# Patient Record
Sex: Female | Born: 1940 | Race: White | Hispanic: No | Marital: Married | Smoking: Former smoker
Health system: Southern US, Community
[De-identification: ages and names within clinical notes are randomized; demographics above are authoritative.]

## PROBLEM LIST (undated history)

## (undated) DIAGNOSIS — E119 Type 2 diabetes mellitus without complications: Secondary | ICD-10-CM

## (undated) DIAGNOSIS — I4891 Unspecified atrial fibrillation: Secondary | ICD-10-CM

## (undated) DIAGNOSIS — I81 Portal vein thrombosis: Principal | ICD-10-CM

## (undated) DIAGNOSIS — I10 Essential (primary) hypertension: Secondary | ICD-10-CM

## (undated) DIAGNOSIS — R160 Hepatomegaly, not elsewhere classified: Secondary | ICD-10-CM

## (undated) DIAGNOSIS — E7800 Pure hypercholesterolemia, unspecified: Secondary | ICD-10-CM

## (undated) DIAGNOSIS — D649 Anemia, unspecified: Secondary | ICD-10-CM

## (undated) DIAGNOSIS — K766 Portal hypertension: Secondary | ICD-10-CM

## (undated) DIAGNOSIS — K55069 Acute infarction of intestine, part and extent unspecified: Secondary | ICD-10-CM

## (undated) DIAGNOSIS — D61818 Other pancytopenia: Secondary | ICD-10-CM

## (undated) DIAGNOSIS — IMO0002 Reserved for concepts with insufficient information to code with codable children: Secondary | ICD-10-CM

## (undated) DIAGNOSIS — Z9289 Personal history of other medical treatment: Secondary | ICD-10-CM

## (undated) DIAGNOSIS — K859 Acute pancreatitis without necrosis or infection, unspecified: Secondary | ICD-10-CM

## (undated) DIAGNOSIS — D735 Infarction of spleen: Secondary | ICD-10-CM

## (undated) HISTORY — PX: SQUAMOUS CELL CARCINOMA EXCISION: SHX2433

## (undated) HISTORY — PX: HERNIA REPAIR: SHX51

## (undated) HISTORY — PX: TONSILLECTOMY: SUR1361

## (undated) HISTORY — PX: COSMETIC SURGERY: SHX468

---

## 1979-07-10 HISTORY — PX: CHOLECYSTECTOMY: SHX55

## 1979-07-10 HISTORY — PX: APPENDECTOMY: SHX54

## 2014-04-08 HISTORY — PX: COLON SURGERY: SHX602

## 2014-04-08 HISTORY — PX: UMBILICAL HERNIA REPAIR: SHX196

## 2014-06-18 ENCOUNTER — Inpatient Hospital Stay (HOSPITAL_COMMUNITY)
Admission: EM | Admit: 2014-06-18 | Discharge: 2014-06-24 | DRG: 441 | Disposition: A | Discharge status: Home / Self Care | Payer: Medicare HMO | Attending: Internal Medicine | Admitting: Internal Medicine

## 2014-06-18 ENCOUNTER — Emergency Department (HOSPITAL_COMMUNITY): Payer: Medicare HMO

## 2014-06-18 ENCOUNTER — Inpatient Hospital Stay (HOSPITAL_COMMUNITY): Payer: Medicare HMO

## 2014-06-18 ENCOUNTER — Encounter (HOSPITAL_COMMUNITY): Payer: Self-pay | Admitting: Emergency Medicine

## 2014-06-18 DIAGNOSIS — D65 Disseminated intravascular coagulation [defibrination syndrome]: Secondary | ICD-10-CM | POA: Diagnosis present

## 2014-06-18 DIAGNOSIS — E119 Type 2 diabetes mellitus without complications: Secondary | ICD-10-CM | POA: Diagnosis present

## 2014-06-18 DIAGNOSIS — I10 Essential (primary) hypertension: Secondary | ICD-10-CM | POA: Diagnosis present

## 2014-06-18 DIAGNOSIS — K55059 Acute (reversible) ischemia of intestine, part and extent unspecified: Secondary | ICD-10-CM | POA: Diagnosis present

## 2014-06-18 DIAGNOSIS — I81 Portal vein thrombosis: Principal | ICD-10-CM | POA: Diagnosis present

## 2014-06-18 DIAGNOSIS — I868 Varicose veins of other specified sites: Secondary | ICD-10-CM | POA: Diagnosis present

## 2014-06-18 DIAGNOSIS — D6859 Other primary thrombophilia: Secondary | ICD-10-CM | POA: Diagnosis present

## 2014-06-18 DIAGNOSIS — R160 Hepatomegaly, not elsewhere classified: Secondary | ICD-10-CM | POA: Diagnosis present

## 2014-06-18 DIAGNOSIS — K763 Infarction of liver: Secondary | ICD-10-CM | POA: Diagnosis present

## 2014-06-18 DIAGNOSIS — K769 Liver disease, unspecified: Secondary | ICD-10-CM | POA: Diagnosis present

## 2014-06-18 DIAGNOSIS — R1013 Epigastric pain: Secondary | ICD-10-CM | POA: Diagnosis present

## 2014-06-18 DIAGNOSIS — D61818 Other pancytopenia: Secondary | ICD-10-CM | POA: Diagnosis present

## 2014-06-18 DIAGNOSIS — Z87891 Personal history of nicotine dependence: Secondary | ICD-10-CM

## 2014-06-18 DIAGNOSIS — I749 Embolism and thrombosis of unspecified artery: Secondary | ICD-10-CM | POA: Diagnosis present

## 2014-06-18 DIAGNOSIS — Z8719 Personal history of other diseases of the digestive system: Secondary | ICD-10-CM

## 2014-06-18 DIAGNOSIS — C44721 Squamous cell carcinoma of skin of unspecified lower limb, including hip: Secondary | ICD-10-CM | POA: Diagnosis present

## 2014-06-18 DIAGNOSIS — K8070 Calculus of gallbladder and bile duct without cholecystitis without obstruction: Secondary | ICD-10-CM | POA: Diagnosis present

## 2014-06-18 DIAGNOSIS — K859 Acute pancreatitis without necrosis or infection, unspecified: Secondary | ICD-10-CM | POA: Insufficient documentation

## 2014-06-18 DIAGNOSIS — E785 Hyperlipidemia, unspecified: Secondary | ICD-10-CM | POA: Diagnosis present

## 2014-06-18 DIAGNOSIS — K766 Portal hypertension: Secondary | ICD-10-CM | POA: Diagnosis present

## 2014-06-18 DIAGNOSIS — K55069 Acute infarction of intestine, part and extent unspecified: Secondary | ICD-10-CM

## 2014-06-18 DIAGNOSIS — K838 Other specified diseases of biliary tract: Secondary | ICD-10-CM | POA: Diagnosis present

## 2014-06-18 DIAGNOSIS — I480 Paroxysmal atrial fibrillation: Secondary | ICD-10-CM

## 2014-06-18 DIAGNOSIS — R1900 Intra-abdominal and pelvic swelling, mass and lump, unspecified site: Secondary | ICD-10-CM | POA: Diagnosis present

## 2014-06-18 DIAGNOSIS — I498 Other specified cardiac arrhythmias: Secondary | ICD-10-CM | POA: Diagnosis present

## 2014-06-18 DIAGNOSIS — D735 Infarction of spleen: Secondary | ICD-10-CM

## 2014-06-18 DIAGNOSIS — Z86718 Personal history of other venous thrombosis and embolism: Secondary | ICD-10-CM

## 2014-06-18 DIAGNOSIS — I4891 Unspecified atrial fibrillation: Secondary | ICD-10-CM | POA: Diagnosis present

## 2014-06-18 DIAGNOSIS — K750 Abscess of liver: Secondary | ICD-10-CM | POA: Diagnosis present

## 2014-06-18 DIAGNOSIS — D7389 Other diseases of spleen: Secondary | ICD-10-CM | POA: Diagnosis present

## 2014-06-18 DIAGNOSIS — R001 Bradycardia, unspecified: Secondary | ICD-10-CM

## 2014-06-18 DIAGNOSIS — Z7901 Long term (current) use of anticoagulants: Secondary | ICD-10-CM

## 2014-06-18 DIAGNOSIS — K7689 Other specified diseases of liver: Secondary | ICD-10-CM | POA: Diagnosis present

## 2014-06-18 DIAGNOSIS — I748 Embolism and thrombosis of other arteries: Secondary | ICD-10-CM | POA: Diagnosis present

## 2014-06-18 DIAGNOSIS — R748 Abnormal levels of other serum enzymes: Secondary | ICD-10-CM | POA: Diagnosis present

## 2014-06-18 HISTORY — DX: Portal vein thrombosis: I81

## 2014-06-18 HISTORY — DX: Other pancytopenia: D61.818

## 2014-06-18 HISTORY — DX: Acute pancreatitis without necrosis or infection, unspecified: K85.90

## 2014-06-18 HISTORY — DX: Hepatomegaly, not elsewhere classified: R16.0

## 2014-06-18 HISTORY — DX: Unspecified atrial fibrillation: I48.91

## 2014-06-18 HISTORY — DX: Acute infarction of intestine, part and extent unspecified: K55.069

## 2014-06-18 HISTORY — DX: Type 2 diabetes mellitus without complications: E11.9

## 2014-06-18 HISTORY — DX: Pure hypercholesterolemia, unspecified: E78.00

## 2014-06-18 HISTORY — DX: Infarction of spleen: D73.5

## 2014-06-18 HISTORY — DX: Essential (primary) hypertension: I10

## 2014-06-18 HISTORY — DX: Anemia, unspecified: D64.9

## 2014-06-18 HISTORY — DX: Personal history of other medical treatment: Z92.89

## 2014-06-18 HISTORY — DX: Portal hypertension: K76.6

## 2014-06-18 HISTORY — DX: Reserved for concepts with insufficient information to code with codable children: IMO0002

## 2014-06-18 LAB — COMPREHENSIVE METABOLIC PANEL
ALT: 135 U/L — ABNORMAL HIGH (ref 0–35)
ANION GAP: 17 — AB (ref 5–15)
AST: 357 U/L — ABNORMAL HIGH (ref 0–37)
Albumin: 3.7 g/dL (ref 3.5–5.2)
Alkaline Phosphatase: 127 U/L — ABNORMAL HIGH (ref 39–117)
BUN: 18 mg/dL (ref 6–23)
CALCIUM: 9.2 mg/dL (ref 8.4–10.5)
CO2: 22 meq/L (ref 19–32)
CREATININE: 0.85 mg/dL (ref 0.50–1.10)
Chloride: 101 mEq/L (ref 96–112)
GFR calc Af Amer: 78 mL/min — ABNORMAL LOW (ref >90)
GFR calc non Af Amer: 67 mL/min — ABNORMAL LOW (ref >90)
GLUCOSE: 392 mg/dL — AB (ref 70–99)
Potassium: 4.7 mEq/L (ref 3.7–5.3)
SODIUM: 140 meq/L (ref 137–147)
TOTAL PROTEIN: 6.5 g/dL (ref 6.0–8.3)
Total Bilirubin: 2.2 mg/dL — ABNORMAL HIGH (ref 0.3–1.2)

## 2014-06-18 LAB — I-STAT CHEM 8, ED
BUN: 21 mg/dL (ref 6–23)
CALCIUM ION: 1.13 mmol/L (ref 1.13–1.30)
Chloride: 103 mEq/L (ref 96–112)
Creatinine, Ser: 1 mg/dL (ref 0.50–1.10)
Glucose, Bld: 405 mg/dL — ABNORMAL HIGH (ref 70–99)
HEMATOCRIT: 37 % (ref 36.0–46.0)
Hemoglobin: 12.6 g/dL (ref 12.0–15.0)
Potassium: 4.3 mEq/L (ref 3.7–5.3)
Sodium: 138 mEq/L (ref 137–147)
TCO2: 25 mmol/L (ref 0–100)

## 2014-06-18 LAB — URINALYSIS, ROUTINE W REFLEX MICROSCOPIC
BILIRUBIN URINE: NEGATIVE
GLUCOSE, UA: 100 mg/dL — AB
KETONES UR: NEGATIVE mg/dL
Leukocytes, UA: NEGATIVE
NITRITE: NEGATIVE
Protein, ur: 30 mg/dL — AB
Specific Gravity, Urine: 1.009 (ref 1.005–1.030)
Urobilinogen, UA: 1 mg/dL (ref 0.0–1.0)
pH: 6 (ref 5.0–8.0)

## 2014-06-18 LAB — URINE MICROSCOPIC-ADD ON

## 2014-06-18 LAB — CBG MONITORING, ED: Glucose-Capillary: 384 mg/dL — ABNORMAL HIGH (ref 70–99)

## 2014-06-18 LAB — CBC WITH DIFFERENTIAL/PLATELET
Basophils Absolute: 0 10*3/uL (ref 0.0–0.1)
Basophils Relative: 0 % (ref 0–1)
EOS ABS: 0.1 10*3/uL (ref 0.0–0.7)
EOS PCT: 1 % (ref 0–5)
HEMATOCRIT: 36.4 % (ref 36.0–46.0)
Hemoglobin: 12.2 g/dL (ref 12.0–15.0)
LYMPHS ABS: 1 10*3/uL (ref 0.7–4.0)
Lymphocytes Relative: 15 % (ref 12–46)
MCH: 29.1 pg (ref 26.0–34.0)
MCHC: 33.5 g/dL (ref 30.0–36.0)
MCV: 86.9 fL (ref 78.0–100.0)
MONO ABS: 0.2 10*3/uL (ref 0.1–1.0)
MONOS PCT: 4 % (ref 3–12)
Neutro Abs: 5.5 10*3/uL (ref 1.7–7.7)
Neutrophils Relative %: 80 % — ABNORMAL HIGH (ref 43–77)
PLATELETS: 123 10*3/uL — AB (ref 150–400)
RBC: 4.19 MIL/uL (ref 3.87–5.11)
RDW: 14 % (ref 11.5–15.5)
WBC: 6.9 10*3/uL (ref 4.0–10.5)

## 2014-06-18 LAB — PROTIME-INR
INR: 2.01 — ABNORMAL HIGH (ref 0.00–1.49)
Prothrombin Time: 22.8 seconds — ABNORMAL HIGH (ref 11.6–15.2)

## 2014-06-18 LAB — I-STAT TROPONIN, ED: TROPONIN I, POC: 0.02 ng/mL (ref 0.00–0.08)

## 2014-06-18 LAB — GLUCOSE, CAPILLARY
GLUCOSE-CAPILLARY: 221 mg/dL — AB (ref 70–99)
Glucose-Capillary: 313 mg/dL — ABNORMAL HIGH (ref 70–99)

## 2014-06-18 LAB — LIPASE, BLOOD: Lipase: >3000 U/L — ABNORMAL HIGH (ref 11–59)

## 2014-06-18 LAB — HEPARIN LEVEL (UNFRACTIONATED)

## 2014-06-18 MED ORDER — INSULIN ASPART 100 UNIT/ML ~~LOC~~ SOLN
0.0000 [IU] | SUBCUTANEOUS | Status: DC
  Administered 2014-06-18: 5 [IU] via SUBCUTANEOUS
  Administered 2014-06-19: 8 [IU] via SUBCUTANEOUS
  Administered 2014-06-19: 3 [IU] via SUBCUTANEOUS
  Administered 2014-06-19: 8 [IU] via SUBCUTANEOUS
  Administered 2014-06-20 (×2): 5 [IU] via SUBCUTANEOUS
  Administered 2014-06-21: 3 [IU] via SUBCUTANEOUS
  Administered 2014-06-21 (×2): 100 [IU] via SUBCUTANEOUS
  Administered 2014-06-21: 11 [IU] via SUBCUTANEOUS

## 2014-06-18 MED ORDER — ONDANSETRON HCL 4 MG/2ML IJ SOLN
4.0000 mg | Freq: Four times a day (QID) | INTRAMUSCULAR | Status: DC | PRN
  Administered 2014-06-20: 4 mg via INTRAVENOUS
  Filled 2014-06-18: qty 2

## 2014-06-18 MED ORDER — HYDROMORPHONE HCL PF 1 MG/ML IJ SOLN: 1.0000 mg | Freq: Once | INTRAMUSCULAR | Status: DC

## 2014-06-18 MED ORDER — MORPHINE SULFATE 2 MG/ML IJ SOLN: 2.0000 mg | INTRAMUSCULAR | Status: DC | PRN

## 2014-06-18 MED ORDER — IOHEXOL 300 MG/ML  SOLN
80.0000 mL | Freq: Once | INTRAMUSCULAR | Status: AC | PRN
  Administered 2014-06-18: 80 mL via INTRAVENOUS

## 2014-06-18 MED ORDER — SODIUM CHLORIDE 0.9 % IJ SOLN
3.0000 mL | Freq: Two times a day (BID) | INTRAMUSCULAR | Status: DC
  Administered 2014-06-18 – 2014-06-24 (×7): 3 mL via INTRAVENOUS

## 2014-06-18 MED ORDER — SODIUM CHLORIDE 0.9 % IV SOLN
INTRAVENOUS | Status: DC
  Administered 2014-06-18 – 2014-06-19 (×3): via INTRAVENOUS

## 2014-06-18 MED ORDER — FENTANYL CITRATE 0.05 MG/ML IJ SOLN
25.0000 ug | Freq: Once | INTRAMUSCULAR | Status: AC
  Administered 2014-06-18: 25 ug via INTRAVENOUS
  Filled 2014-06-18: qty 2

## 2014-06-18 MED ORDER — HYDROMORPHONE HCL PF 1 MG/ML IJ SOLN
0.5000 mg | INTRAMUSCULAR | Status: DC | PRN
  Administered 2014-06-19 – 2014-06-20 (×3): 0.5 mg via INTRAVENOUS
  Filled 2014-06-18 (×3): qty 1

## 2014-06-18 MED ORDER — SODIUM CHLORIDE 0.9 % IV BOLUS (SEPSIS)
1000.0000 mL | Freq: Once | INTRAVENOUS | Status: AC
  Administered 2014-06-18: 1000 mL via INTRAVENOUS

## 2014-06-18 MED ORDER — SODIUM CHLORIDE 0.9 % IV BOLUS (SEPSIS)
1000.0000 mL | Freq: Once | INTRAVENOUS | Status: AC
Start: 2014-06-18 — End: 2014-06-18
  Administered 2014-06-18: 1000 mL via INTRAVENOUS

## 2014-06-18 MED ORDER — INSULIN ASPART 100 UNIT/ML ~~LOC~~ SOLN: 0.0000 [IU] | Freq: Three times a day (TID) | SUBCUTANEOUS | Status: DC

## 2014-06-18 MED ORDER — SODIUM CHLORIDE 0.9 % IV SOLN: INTRAVENOUS | Status: DC

## 2014-06-18 MED ORDER — HEPARIN (PORCINE) IN NACL 100-0.45 UNIT/ML-% IJ SOLN
950.0000 [IU]/h | INTRAMUSCULAR | Status: DC
  Administered 2014-06-18: 950 [IU]/h via INTRAVENOUS
  Filled 2014-06-18: qty 250

## 2014-06-18 MED ORDER — HYDROMORPHONE HCL PF 1 MG/ML IJ SOLN
0.5000 mg | Freq: Once | INTRAMUSCULAR | Status: AC
  Administered 2014-06-18: 0.5 mg via INTRAVENOUS
  Filled 2014-06-18: qty 1

## 2014-06-18 MED ORDER — ONDANSETRON HCL 4 MG PO TABS
4.0000 mg | ORAL_TABLET | Freq: Four times a day (QID) | ORAL | Status: DC | PRN
  Administered 2014-06-21: 4 mg via ORAL
  Filled 2014-06-18: qty 1

## 2014-06-18 NOTE — ED Provider Notes (Signed)
Medical screening examination/treatment/procedure(s) were conducted as a shared visit with non-physician practitioner(s) and myself.  I personally evaluated the patient during the encounter. 73yo F, c/o upper abd "pain" that began approximately noon PTA. Describes the discomfort as "pressure," with radiation into her lower mid-chest. Has been associated with N/V. Pt endorses hx of pancreatitis, states her symptoms today are similar. States she was admitted to a hospital in [place] the first week of June for "a blood clot in my stomach." Pt is unable to be more specific. Records requested. VSS, A&O, vague historian, CTA, RRR, neuro non-focal, +TTP mid-epigastric area. LFT's and lipase elevated. [place] with CBD dilatation. NPO, IVF, GI to consult, medicine to admit.     EKG Interpretation   Date/Time:  Tuesday June 18 2014 13:00:17 EDT Ventricular Rate:  50 PR Interval:  143 QRS Duration: 95 QT Interval:  458 QTC Calculation: 418 R Axis:   -14 Text Interpretation:  Sinus rhythm Abnormal R-wave progression, early  transition LVH with secondary repolarization abnormality Baseline wander  No old tracing to compare Confirmed by [facility]  MD, Nunzio Cory [phone]) on  06/18/2014 1:33:50 PM      Results for orders placed during the hospital encounter of 06/18/14  CBC WITH DIFFERENTIAL      Result Value Ref Range   WBC 6.9  4.0 - 10.5 K/uL   RBC 4.19  3.87 - 5.11 MIL/uL   Hemoglobin 12.2  12.0 - 15.0 g/dL   HCT 36.4  36.0 - 46.0 %   MCV 86.9  78.0 - 100.0 fL   MCH 29.1  26.0 - 34.0 pg   MCHC 33.5  30.0 - 36.0 g/dL   RDW 14.0  11.5 - 15.5 %   Platelets 123 (*) 150 - 400 K/uL   Neutrophils Relative % 80 (*) 43 - 77 %   Neutro Abs 5.5  1.7 - 7.7 K/uL   Lymphocytes Relative 15  12 - 46 %   Lymphs Abs 1.0  0.7 - 4.0 K/uL   Monocytes Relative 4  3 - 12 %   Monocytes Absolute 0.2  0.1 - 1.0 K/uL   Eosinophils Relative 1  0 - 5 %   Eosinophils Absolute 0.1  0.0 - 0.7 K/uL   Basophils Relative 0  0 - 1  %   Basophils Absolute 0.0  0.0 - 0.1 K/uL  COMPREHENSIVE METABOLIC PANEL      Result Value Ref Range   Sodium 140  137 - 147 mEq/L   Potassium 4.7  3.7 - 5.3 mEq/L   Chloride 101  96 - 112 mEq/L   CO2 22  19 - 32 mEq/L   Glucose, Bld 392 (*) 70 - 99 mg/dL   BUN 18  6 - 23 mg/dL   Creatinine, Ser 0.85  0.50 - 1.10 mg/dL   Calcium 9.2  8.4 - 10.5 mg/dL   Total Protein 6.5  6.0 - 8.3 g/dL   Albumin 3.7  3.5 - 5.2 g/dL   AST 357 (*) 0 - 37 U/L   ALT 135 (*) 0 - 35 U/L   Alkaline Phosphatase 127 (*) 39 - 117 U/L   Total Bilirubin 2.2 (*) 0.3 - 1.2 mg/dL   GFR calc non Af Amer 67 (*) >90 mL/min   GFR calc Af Amer 78 (*) >90 mL/min   Anion gap 17 (*) 5 - 15  LIPASE, BLOOD      Result Value Ref Range   Lipase >3000 (*) 11 - 59 U/L  CBG MONITORING, ED      Result Value Ref Range   Glucose-Capillary 384 (*) 70 - 99 mg/dL  I-STAT TROPOININ, ED      Result Value Ref Range   Troponin i, poc 0.02  0.00 - 0.08 ng/mL   Comment 3           I-STAT CHEM 8, ED      Result Value Ref Range   Sodium 138  137 - 147 mEq/L   Potassium 4.3  3.7 - 5.3 mEq/L   Chloride 103  96 - 112 mEq/L   BUN 21  6 - 23 mg/dL   Creatinine, Ser 1.00  0.50 - 1.10 mg/dL   Glucose, Bld 405 (*) 70 - 99 mg/dL   Calcium, Ion 1.13  1.13 - 1.30 mmol/L   TCO2 25  0 - 100 mmol/L   Hemoglobin 12.6  12.0 - 15.0 g/dL   HCT 37.0  36.0 - 46.0 %   Dg Chest 2 View 06/18/2014   CLINICAL DATA:  Chest pain  EXAM: CHEST  2 VIEW  COMPARISON:  None.  FINDINGS: The cardiac and mediastinal silhouettes are stable in size and contour, and remain within normal limits.  The lungs are normally inflated. No airspace consolidation, pleural effusion, or pulmonary edema is identified. There is no pneumothorax.  No acute osseous abnormality identified. Minimal degenerative spurring noted within the mid thoracic spine. Surgical clips overlie the left axilla.  IMPRESSION: No active cardiopulmonary disease.   Electronically Signed   By: Jeannine Boga M.D.   On: 06/18/2014 14:00   US Abdomen Complete 06/18/2014   CLINICAL DATA:  Upper abdominal pain, elevated LFTs  EXAM: ULTRASOUND ABDOMEN COMPLETE  COMPARISON:  None.  FINDINGS: Gallbladder:  Surgically absent  Common bile duct:  Diameter: CBD dilatation with CBD measuring 18.3 mm.  Liver:  There is heterogeneous liver with intrahepatic biliary ductal dilatation. Hypoechoic cystic lesion in right hepatic lobe centrally measures 1.7 x 1.5 cm.  IVC:  No abnormality visualized.  Pancreas:  Limited assessment due to abundant bowel gas  Spleen:  Size and appearance within normal limits.  Right Kidney:  Length: 11 cm. Echogenicity within normal limits. No mass or hydronephrosis visualized.  Left Kidney:  Length: 12.9 cm. Echogenicity within normal limits. No mass or hydronephrosis visualized.  Bilateral renal cortical thinning probable due to atrophy.  Abdominal aorta:  No aneurysm visualized.  Measures up to 2.7 cm in diameter.  Other findings:  None.  IMPRESSION: 1. Surgically absent gallbladder.  CBD dilatation up to 1.8 cm. 2. There is intrahepatic biliary ductal dilatation. Cystic lesion in right hepatic lobe centrally measures 1.7 x 1.5 cm. Further correlation with enhanced CT or MRI could be performed. 3. Bilateral renal cortical thinning probable due to atrophy. No hydronephrosis. No focal renal mass. 4. No aortic aneurysm.   Electronically Signed   By: Lahoma Crocker M.D.   On: 06/18/2014 16:18      Francine Graven, DO 06/20/14 1546

## 2014-06-18 NOTE — ED Notes (Addendum)
Pt returns from  Radiology. Placed  Back on cardiac monitoring.

## 2014-06-18 NOTE — ED Provider Notes (Signed)
CSN: [id number]     Arrival date & time 06/18/14  1250 History   First MD Initiated Contact with Patient 06/18/14 1325     Chief Complaint  Patient presents with  . Chest Pain     (Consider location/radiation/quality/duration/timing/severity/associated sxs/prior Treatment) HPI Comments: Wendy Meza is a 73 y.o. female with a past medical history of HTN, DM, a-fib presenting the Emergency Department with a chief complaint of epigastric abdominal pain onset 1200, while driving. The patient reports increase in chest discomfort since onset of abdominal pain. Patient reports mid sternal pain, non-radiating. She reports associated emesis. Denies shortness of breath. Patient reports she had a procedure to "remove a blood clot in her abdomen" in June in [place].   Patient reports lightheadedness.  Upon further questioning the patient reports she has a history of cholecystectomy, and pancreatitis. She reports pancreatitis and 2002 was treated at [facility]. She reports cholecystectomy prior to pancreatitis. Denies EtOH use, tobacco use.  Patient is a 73 y.o. female presenting with chest pain. The history is provided by the patient. No language interpreter was used.  Chest Pain Associated symptoms: abdominal pain, nausea and vomiting   Associated symptoms: no fever, no palpitations and no shortness of breath     Past Medical History  Diagnosis Date  . Hypertension   . Diabetes mellitus without complication   . Cancer   . A-fib    History reviewed. No pertinent past surgical history. No family history on file. History  Substance Use Topics  . Smoking status: Never Smoker   . Smokeless tobacco: Not on file  . Alcohol Use: No   OB History   Grav Para Term Preterm Abortions TAB SAB Ect Mult Living                 Review of Systems  Constitutional: Negative for fever and chills.  Respiratory: Negative for shortness of breath.   Cardiovascular: Positive for chest pain.  Negative for palpitations and leg swelling.  Gastrointestinal: Positive for nausea, vomiting and abdominal pain. Negative for diarrhea and constipation.      Allergies  Polysporin  Home Medications   Prior to Admission medications   Not on File   BP 124/45  Pulse 50  Temp(Src) 98 F (36.7 C) (Oral)  Resp 18  SpO2 99% Physical Exam  Nursing note and vitals reviewed. Constitutional: She is oriented to person, place, and time. She appears well-developed and well-nourished. No distress.  Appears uncomfortable  HENT:  Head: Normocephalic and atraumatic.  Eyes: EOM are normal. Pupils are equal, round, and reactive to light.  Neck: Neck supple.  Cardiovascular: Regular rhythm.  Bradycardia present.   Murmur heard. Trace pitting edema bilaterally.  Abdominal: Soft. Normal appearance. She exhibits no distension and no mass. There is tenderness in the right upper quadrant and epigastric area. There is guarding. There is no rebound.  Well healed lower midline scar.  Musculoskeletal: Normal range of motion.  Neurological: She is alert and oriented to person, place, and time.  Skin: Skin is warm. She is not diaphoretic.  Psychiatric: She has a normal mood and affect. Her behavior is normal.    ED Course  Procedures (including critical care time) Labs Review Results for orders placed during the hospital encounter of 06/18/14  CBC WITH DIFFERENTIAL      Result Value Ref Range   WBC 6.9  4.0 - 10.5 K/uL   RBC 4.19  3.87 - 5.11 MIL/uL   Hemoglobin 12.2  12.0 - 15.0 g/dL   HCT 36.4  36.0 - 46.0 %   MCV 86.9  78.0 - 100.0 fL   MCH 29.1  26.0 - 34.0 pg   MCHC 33.5  30.0 - 36.0 g/dL   RDW 14.0  11.5 - 15.5 %   Platelets 123 (*) 150 - 400 K/uL   Neutrophils Relative % 80 (*) 43 - 77 %   Neutro Abs 5.5  1.7 - 7.7 K/uL   Lymphocytes Relative 15  12 - 46 %   Lymphs Abs 1.0  0.7 - 4.0 K/uL   Monocytes Relative 4  3 - 12 %   Monocytes Absolute 0.2  0.1 - 1.0 K/uL   Eosinophils  Relative 1  0 - 5 %   Eosinophils Absolute 0.1  0.0 - 0.7 K/uL   Basophils Relative 0  0 - 1 %   Basophils Absolute 0.0  0.0 - 0.1 K/uL  COMPREHENSIVE METABOLIC PANEL      Result Value Ref Range   Sodium 140  137 - 147 mEq/L   Potassium 4.7  3.7 - 5.3 mEq/L   Chloride 101  96 - 112 mEq/L   CO2 22  19 - 32 mEq/L   Glucose, Bld 392 (*) 70 - 99 mg/dL   BUN 18  6 - 23 mg/dL   Creatinine, Ser 0.85  0.50 - 1.10 mg/dL   Calcium 9.2  8.4 - 10.5 mg/dL   Total Protein 6.5  6.0 - 8.3 g/dL   Albumin 3.7  3.5 - 5.2 g/dL   AST 357 (*) 0 - 37 U/L   ALT 135 (*) 0 - 35 U/L   Alkaline Phosphatase 127 (*) 39 - 117 U/L   Total Bilirubin 2.2 (*) 0.3 - 1.2 mg/dL   GFR calc non Af Amer 67 (*) >90 mL/min   GFR calc Af Amer 78 (*) >90 mL/min   Anion gap 17 (*) 5 - 15  LIPASE, BLOOD      Result Value Ref Range   Lipase >3000 (*) 11 - 59 U/L  CBG MONITORING, ED      Result Value Ref Range   Glucose-Capillary 384 (*) 70 - 99 mg/dL  I-STAT TROPOININ, ED      Result Value Ref Range   Troponin i, poc 0.02  0.00 - 0.08 ng/mL   Comment 3           I-STAT CHEM 8, ED      Result Value Ref Range   Sodium 138  137 - 147 mEq/L   Potassium 4.3  3.7 - 5.3 mEq/L   Chloride 103  96 - 112 mEq/L   BUN 21  6 - 23 mg/dL   Creatinine, Ser 1.00  0.50 - 1.10 mg/dL   Glucose, Bld 405 (*) 70 - 99 mg/dL   Calcium, Ion 1.13  1.13 - 1.30 mmol/L   TCO2 25  0 - 100 mmol/L   Hemoglobin 12.6  12.0 - 15.0 g/dL   HCT 37.0  36.0 - 46.0 %   Imaging Review Dg Chest 2 View  06/18/2014   CLINICAL DATA:  Chest pain  EXAM: CHEST  2 VIEW  COMPARISON:  None.  FINDINGS: The cardiac and mediastinal silhouettes are stable in size and contour, and remain within normal limits.  The lungs are normally inflated. No airspace consolidation, pleural effusion, or pulmonary edema is identified. There is no pneumothorax.  No acute osseous abnormality identified. Minimal degenerative spurring noted within the mid thoracic  spine. Surgical clips  overlie the left axilla.  IMPRESSION: No active cardiopulmonary disease.   Electronically Signed   By: Jeannine Boga M.D.   On: 06/18/2014 14:00   US Abdomen Complete  06/18/2014   CLINICAL DATA:  Upper abdominal pain, elevated LFTs  EXAM: ULTRASOUND ABDOMEN COMPLETE  COMPARISON:  None.  FINDINGS: Gallbladder:  Surgically absent  Common bile duct:  Diameter: CBD dilatation with CBD measuring 18.3 mm.  Liver:  There is heterogeneous liver with intrahepatic biliary ductal dilatation. Hypoechoic cystic lesion in right hepatic lobe centrally measures 1.7 x 1.5 cm.  IVC:  No abnormality visualized.  Pancreas:  Limited assessment due to abundant bowel gas  Spleen:  Size and appearance within normal limits.  Right Kidney:  Length: 11 cm. Echogenicity within normal limits. No mass or hydronephrosis visualized.  Left Kidney:  Length: 12.9 cm. Echogenicity within normal limits. No mass or hydronephrosis visualized.  Bilateral renal cortical thinning probable due to atrophy.  Abdominal aorta:  No aneurysm visualized.  Measures up to 2.7 cm in diameter.  Other findings:  None.  IMPRESSION: 1. Surgically absent gallbladder.  CBD dilatation up to 1.8 cm. 2. There is intrahepatic biliary ductal dilatation. Cystic lesion in right hepatic lobe centrally measures 1.7 x 1.5 cm. Further correlation with enhanced CT or MRI could be performed. 3. Bilateral renal cortical thinning probable due to atrophy. No hydronephrosis. No focal renal mass. 4. No aortic aneurysm.   Electronically Signed   By: Lahoma Crocker M.D.   On: 06/18/2014 16:18     EKG Interpretation   Date/Time:  Tuesday June 18 2014 13:00:17 EDT Ventricular Rate:  50 PR Interval:  143 QRS Duration: 95 QT Interval:  458 QTC Calculation: 418 R Axis:   -14 Text Interpretation:  Sinus rhythm Abnormal R-wave progression, early  transition LVH with secondary repolarization abnormality Baseline wander  No old tracing to compare Confirmed by [facility]  MD,  Nunzio Cory [phone]) on  06/18/2014 1:33:50 PM      MDM   Final diagnoses:  Acute pancreatitis, unspecified pancreatitis type  Dilated cbd, acquired   Pt presents with chest/epigastric tenderness on palpation.  The patient's pulse 49 on arrival, BP 124/45.  Labs sent. Questionable abdominal etiologic vs cardiac. Pt has unknown surgery previously, history of A-fib. Poor historian.  CBG, 384 fluids given. Dr. Thurnell Garbe also evaluated the pt during this encounter. CMP shows elevation of LFTs, Lipase >3000. US abdomen ordered. Re-evaluation HR 69, reports mild resolution of symptoms.  Discussed lab results, pt reports history of cholecystectomy several years ago and history of pancreatitis.  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 US shows dilated CBD, discussed with Dr. Benson Norway who agrees to admission. Consult to medicine for admission. Pt care assumed by [place], PA-C at shift change, awaiting returned page for admission. Meds given in ED:  Medications  fentaNYL (SUBLIMAZE) injection 25 mcg (25 mcg Intravenous Given 06/18/14 1422)  sodium chloride 0.9 % bolus 1,000 mL (1,000 mLs Intravenous New Bag/Given 06/18/14 1354)  HYDROmorphone (DILAUDID) injection 0.5 mg (0.5 mg Intravenous Given 06/18/14 1526)    New Prescriptions   No medications on file      Harvie Heck, PA-C 06/20/14 1036

## 2014-06-18 NOTE — ED Notes (Signed)
Consulting MD at bedside

## 2014-06-18 NOTE — H&P (Signed)
Date: 06/18/2014               Patient Name:  Wendy Meza MRN: [id number]  DOB: 31-May-1941 Age / Sex: 73 y.o., female   PCP: Provider Default, MD         Medical Service: Internal Medicine Teaching Service         Attending Physician: Dr. Axel Filler, MD    First Contact: Dr. Marvel Plan Pager: [phone]  Second Contact: Dr. Alice Rieger Pager: [phone]       After Hours (After 5p/  First Contact Pager: [phone]  weekends / holidays): Second Contact Pager: [phone]   Chief Complaint: Epigastric pain  History of Present Illness: Wendy Meza is a 73 year old woman with history of HTN, DM, atrial fibrillation on Eliquis, pancreatitis in 6440 complicated by pseudocyst s/p drainage, ischemic colitis s/p ex lap 04/12/2014 presenting with epigastric pain, nausea, and vomiting. She reports the epigastric pain started this afternoon. It was constant, dull pain. She last ate at 8AM. No exacerbating factors identified. She reports dizziness and lightheadedness and steatorrhea - last BM this AM. She denies fevers, chills, chest pain, shortness of breath, melena, hematochezia, dysuria, hematuria, dark urine, edema, rash, jaundice, pruritus, myalgias. This pain is similar to her past episode of pancreatitis in 2002. Last colonoscopy 4-5 years ago without any abnormal findings per patient. She had an upper scope in 2002 during the pancreatitis.  Meds: Current Facility-Administered Medications  Medication Dose Route Frequency Provider Last Rate Last Dose  . 0.9 %  sodium chloride infusion   Intravenous Continuous Francine Graven, DO 75 mL/hr at 06/18/14 1700    . 0.9 %  sodium chloride infusion   Intravenous Continuous Beryle Beams, MD       Current Outpatient Prescriptions  Medication Sig Dispense Refill  . CRESTOR 20 MG tablet Take 20 mg by mouth at bedtime.       Marland Kitchen ELIQUIS 5 MG TABS tablet Take 5 mg by mouth 2 (two) times daily.      . furosemide (LASIX) 40 MG tablet Take 40 mg by mouth daily.        Marland Kitchen HUMALOG KWIKPEN 100 UNIT/ML KiwkPen Inject 10 Units into the skin daily as needed (takes 10 units if CBG > 160).       Marland Kitchen KLOR-CON M20 20 MEQ tablet Take 20 mEq by mouth 2 (two) times daily.      Marland Kitchen LANTUS SOLOSTAR 100 UNIT/ML Solostar Pen Inject 30 Units into the skin at bedtime.      Marland Kitchen lisinopril (PRINIVIL,ZESTRIL) 40 MG tablet Take 20 mg by mouth 2 (two) times daily.       . metFORMIN (GLUCOPHAGE) 500 MG tablet Take 500 mg by mouth 2 (two) times daily.      . metoprolol (LOPRESSOR) 50 MG tablet Take 25 mg by mouth every morning.       . ranitidine (ZANTAC) 150 MG tablet Take 150 mg by mouth 2 (two) times daily.      . sotalol (BETAPACE) 80 MG tablet Take 80 mg by mouth 2 (two) times daily.       Marland Kitchen NITROSTAT 0.4 MG SL tablet Place 0.4 mg under the tongue every 5 (five) minutes as needed.        Allergies: Allergies as of 06/18/2014 - Review Complete 06/18/2014  Allergen Reaction Noted  . Polysporin [bacitracin-polymyxin b] Other (See Comments) 06/18/2014   Past Medical History  Diagnosis Date  . Hypertension   . Diabetes mellitus without complication   .  Cancer   . A-fib   . Pancreatitis   Squamous cell of bilateral lower extremity Ischemic colitis 04/12/2014  Past Surgical History  Procedure Laterality Date  . Cholecystectomy    Ex lap for ischemic colitis 04/12/2014 Pancreatic pseudocyst drainage in 2002  No family history on file. Denies family history of pancreatic, liver, or gallbladder disease.  History   Social History  . Marital Status: Married    Spouse Name: N/A    Number of Children: N/A  . Years of Education: N/A   Occupational History  . Not on file.   Social History Main Topics  . Smoking status: Never Smoker   . Smokeless tobacco: Not on file  . Alcohol Use: No  . Drug Use: Not on file  . Sexual Activity: Not on file   Other Topics Concern  . Not on file   Social History Narrative  . No narrative on file    Review of Systems: Constitutional:  no fevers/chills Eyes: no vision changes Ears, nose, mouth, throat, and face: no cough Respiratory: no shortness of breath Cardiovascular: no chest pain Gastrointestinal: per HPI Genitourinary: no dysuria, no hematuria Integument: no rash Hematologic/lymphatic: no bleeding/bruising, no edema Musculoskeletal: no myalgias Neurological: no paresthesias, no weakness  Physical Exam: Blood pressure 166/62, pulse 63, temperature 98 F (36.7 C), temperature source Oral, resp. rate 18, SpO2 94.00%. General Apperance: NAD Head: Normocephalic, atraumatic Eyes: PERRL, EOMI, anicteric sclera Ears: Nares normal, septum midline, mucosa normal Throat: Lips, mucosa and tongue normal  Neck: Supple, trachea midline Back: No tenderness or bony abnormality  Lungs: Clear to auscultation bilaterally. No wheezes, rhonchi or rales. Breathing comfortably Chest Wall: Nontender, no deformity Heart: Regular rate and rhythm, no murmur/rub/gallop Abdomen: Soft, moderately tender to palpation in epigastric region, nondistended, no rebound/guarding Extremities: Normal, well healed scars in bilateral lower extremities from squamous cell carcinoma resection, warm and well perfused, no edema Pulses: 2+ throughout Skin: No rashes or lesions Neurologic: Alert and oriented x 3. CNII-XII intact. Normal strength and sensation  Lab results: Basic Metabolic Panel:  Recent Labs  06/18/14 1333 06/18/14 1358  NA 140 138  K 4.7 4.3  CL 101 103  CO2 22  --   GLUCOSE 392* 405*  BUN 18 21  CREATININE 0.85 1.00  CALCIUM 9.2  --    Liver Function Tests:  Recent Labs  06/18/14 1333  AST 357*  ALT 135*  ALKPHOS 127*  BILITOT 2.2*  PROT 6.5  ALBUMIN 3.7    Recent Labs  06/18/14 1333  LIPASE >3000*    CBC:  Recent Labs  06/18/14 1333 06/18/14 1358  WBC 6.9  --   NEUTROABS 5.5  --   HGB 12.2 12.6  HCT 36.4 37.0  MCV 86.9  --   PLT 123*  --    CBG:  Recent Labs  06/18/14 1303  GLUCAP 384*     Urinalysis    Component Value Date/Time   COLORURINE YELLOW 06/18/2014 1918   APPEARANCEUR HAZY* 06/18/2014 1918   LABSPEC 1.009 06/18/2014 1918   PHURINE 6.0 06/18/2014 1918   GLUCOSEU 100* 06/18/2014 1918   HGBUR LARGE* 06/18/2014 1918   BILIRUBINUR NEGATIVE 06/18/2014 1918   KETONESUR NEGATIVE 06/18/2014 1918   PROTEINUR 30* 06/18/2014 1918   UROBILINOGEN 1.0 06/18/2014 1918   NITRITE NEGATIVE 06/18/2014 1918   LEUKOCYTESUR NEGATIVE 06/18/2014 1918    Misc. Labs: Troponin POC 0.02  Imaging results:  Dg Chest 2 View  06/18/2014   CLINICAL DATA:  Chest  pain  EXAM: CHEST  2 VIEW  COMPARISON:  None.  FINDINGS: The cardiac and mediastinal silhouettes are stable in size and contour, and remain within normal limits.  The lungs are normally inflated. No airspace consolidation, pleural effusion, or pulmonary edema is identified. There is no pneumothorax.  No acute osseous abnormality identified. Minimal degenerative spurring noted within the mid thoracic spine. Surgical clips overlie the left axilla.  IMPRESSION: No active cardiopulmonary disease.   Electronically Signed   By: Jeannine Boga M.D.   On: 06/18/2014 14:00   US Abdomen Complete  06/18/2014   CLINICAL DATA:  Upper abdominal pain, elevated LFTs  EXAM: ULTRASOUND ABDOMEN COMPLETE  COMPARISON:  None.  FINDINGS: Gallbladder:  Surgically absent  Common bile duct:  Diameter: CBD dilatation with CBD measuring 18.3 mm.  Liver:  There is heterogeneous liver with intrahepatic biliary ductal dilatation. Hypoechoic cystic lesion in right hepatic lobe centrally measures 1.7 x 1.5 cm.  IVC:  No abnormality visualized.  Pancreas:  Limited assessment due to abundant bowel gas  Spleen:  Size and appearance within normal limits.  Right Kidney:  Length: 11 cm. Echogenicity within normal limits. No mass or hydronephrosis visualized.  Left Kidney:  Length: 12.9 cm. Echogenicity within normal limits. No mass or hydronephrosis visualized.  Bilateral renal  cortical thinning probable due to atrophy.  Abdominal aorta:  No aneurysm visualized.  Measures up to 2.7 cm in diameter.  Other findings:  None.  IMPRESSION: 1. Surgically absent gallbladder.  CBD dilatation up to 1.8 cm. 2. There is intrahepatic biliary ductal dilatation. Cystic lesion in right hepatic lobe centrally measures 1.7 x 1.5 cm. Further correlation with enhanced CT or MRI could be performed. 3. Bilateral renal cortical thinning probable due to atrophy. No hydronephrosis. No focal renal mass. 4. No aortic aneurysm.   Electronically Signed   By: Lahoma Crocker M.D.   On: 06/18/2014 16:18    Other results: EKG: normal EKG, normal sinus rhythm, there are no previous tracings available for comparison.  Assessment & Plan by Problem:  Epigastric pain, N/V: lipase >3000. Highly concerning for acute pancreatitis. AST, ALT, and T bili elevated. [place] of abdomen demonstrates CBD dilatation up to 1.8cm. Worrisome for obstruction either from common bile duct gallstone or mass. Other differential diagnoses include duodenal ulcer and bowel infarction, although less likely. GI consulted by ED. -CT abd/pelvis with IV contrast -NPO, IVF NS@75ml /hr, pain control with dilaudid prn -zofran prn nausea -ERCP Thursday afternoon per GI -hold Eliquis, start heparin gtt  A fib: On Eliquis at home. In sinus rhythm today.  -hold Eliquis, start heparin gtt  DM: on metformin and insulin at home -Hold home insulin and metformin -SSI, Accuchecks  HTN:  -hold home furosemide, lisinopril, metoprolol, sotalol  HLD: chronic, stable -hold home Crestor  FEN: -NPO -NS@75ml /hr  DVT ppx: heparin gtt  Dispo: Disposition is deferred at this time, awaiting improvement of current medical problems. Anticipated discharge in approximately 2 day(s).   The patient does have a current PCP (Provider Default, MD) and does not need an [facility] hospital follow-up appointment after discharge.  The patient does not know have  transportation limitations that hinder transportation to clinic appointments.  Signed: Jacques Earthly, MD 06/18/2014, 7:39 PM

## 2014-06-18 NOTE — ED Notes (Signed)
Patient with chest pain starting approx 30 min ago, patient states mid sternal pain and pressure, n/v associated, patient with history of htn,a-fib, and recent surgery in June 15, for blood clot in abdomen

## 2014-06-18 NOTE — ED Notes (Signed)
Faxed Release of Info to [facility]

## 2014-06-18 NOTE — Consult Note (Signed)
Unassigned Consult  Reason for Consult: Acute pancreatitis and elevated liver enzymes Referring Physician: [facility] HPI: This is a 73 year old female with a PMH of DM, HTN, s/p cholecystectomy in the 1980's for cholelithiasis, acute pancreatitis in 2000 admitted for acute pancreatitis.  She is visit from [place], but she is from [place], [place].  Today she started to have acute epigastric abdominal pain and further evaluation revealed that she has a lipase >3,000.  An ultrasound was performed and it was notable for a dilated CBD at 1.8 cm, but no evidence of any stones.  Currently she does feel better with pain medications.  In 2000 she had a similar episodes and she was told that her pancreatitis was idiopathic.  During this evaluation her liver enzymes have increased consistent with an obstructive pattern.  Incidentally she had abdominal surgery in June this year for an abdominal clot and she takes Eliquis, but she does not know the etiology of the abdominal clot.  Past Medical History  Diagnosis Date  . Hypertension   . Diabetes mellitus without complication   . Cancer   . A-fib   . Pancreatitis     Past Surgical History  Procedure Laterality Date  . Cholecystectomy      No family history on file.  Social History:  reports that she has never smoked. She does not have any smokeless tobacco history on file. She reports that she does not drink alcohol. Her drug history is not on file.  Allergies:  Allergies  Allergen Reactions  . Polysporin [Bacitracin-Polymyxin B] Other (See Comments)    unknown    Medications:  Scheduled:  Continuous: . sodium chloride 75 mL/hr at 06/18/14 1700  . sodium chloride      Results for orders placed during the hospital encounter of 06/18/14 (from the past 24 hour(s))  CBG MONITORING, ED     Status: Abnormal   Collection Time    06/18/14  1:03 PM      Result Value Ref Range   Glucose-Capillary 384 (*) 70 - 99 mg/dL  CBC WITH  DIFFERENTIAL     Status: Abnormal   Collection Time    06/18/14  1:33 PM      Result Value Ref Range   WBC 6.9  4.0 - 10.5 K/uL   RBC 4.19  3.87 - 5.11 MIL/uL   Hemoglobin 12.2  12.0 - 15.0 g/dL   HCT 36.4  36.0 - 46.0 %   MCV 86.9  78.0 - 100.0 fL   MCH 29.1  26.0 - 34.0 pg   MCHC 33.5  30.0 - 36.0 g/dL   RDW 14.0  11.5 - 15.5 %   Platelets 123 (*) 150 - 400 K/uL   Neutrophils Relative % 80 (*) 43 - 77 %   Neutro Abs 5.5  1.7 - 7.7 K/uL   Lymphocytes Relative 15  12 - 46 %   Lymphs Abs 1.0  0.7 - 4.0 K/uL   Monocytes Relative 4  3 - 12 %   Monocytes Absolute 0.2  0.1 - 1.0 K/uL   Eosinophils Relative 1  0 - 5 %   Eosinophils Absolute 0.1  0.0 - 0.7 K/uL   Basophils Relative 0  0 - 1 %   Basophils Absolute 0.0  0.0 - 0.1 K/uL  COMPREHENSIVE METABOLIC PANEL     Status: Abnormal   Collection Time    06/18/14  1:33 PM      Result Value Ref Range  Sodium 140  137 - 147 mEq/L   Potassium 4.7  3.7 - 5.3 mEq/L   Chloride 101  96 - 112 mEq/L   CO2 22  19 - 32 mEq/L   Glucose, Bld 392 (*) 70 - 99 mg/dL   BUN 18  6 - 23 mg/dL   Creatinine, Ser 0.85  0.50 - 1.10 mg/dL   Calcium 9.2  8.4 - 10.5 mg/dL   Total Protein 6.5  6.0 - 8.3 g/dL   Albumin 3.7  3.5 - 5.2 g/dL   AST 357 (*) 0 - 37 U/L   ALT 135 (*) 0 - 35 U/L   Alkaline Phosphatase 127 (*) 39 - 117 U/L   Total Bilirubin 2.2 (*) 0.3 - 1.2 mg/dL   GFR calc non Af Amer 67 (*) >90 mL/min   GFR calc Af Amer 78 (*) >90 mL/min   Anion gap 17 (*) 5 - 15  LIPASE, BLOOD     Status: Abnormal   Collection Time    06/18/14  1:33 PM      Result Value Ref Range   Lipase >3000 (*) 11 - 59 U/L  I-STAT TROPOININ, ED     Status: None   Collection Time    06/18/14  1:56 PM      Result Value Ref Range   Troponin i, poc 0.02  0.00 - 0.08 ng/mL   Comment 3           I-STAT CHEM 8, ED     Status: Abnormal   Collection Time    06/18/14  1:58 PM      Result Value Ref Range   Sodium 138  137 - 147 mEq/L   Potassium 4.3  3.7 - 5.3 mEq/L    Chloride 103  96 - 112 mEq/L   BUN 21  6 - 23 mg/dL   Creatinine, Ser 1.00  0.50 - 1.10 mg/dL   Glucose, Bld 405 (*) 70 - 99 mg/dL   Calcium, Ion 1.13  1.13 - 1.30 mmol/L   TCO2 25  0 - 100 mmol/L   Hemoglobin 12.6  12.0 - 15.0 g/dL   HCT 37.0  36.0 - 46.0 %     Dg Chest 2 View  06/18/2014   CLINICAL DATA:  Chest pain  EXAM: CHEST  2 VIEW  COMPARISON:  None.  FINDINGS: The cardiac and mediastinal silhouettes are stable in size and contour, and remain within normal limits.  The lungs are normally inflated. No airspace consolidation, pleural effusion, or pulmonary edema is identified. There is no pneumothorax.  No acute osseous abnormality identified. Minimal degenerative spurring noted within the mid thoracic spine. Surgical clips overlie the left axilla.  IMPRESSION: No active cardiopulmonary disease.   Electronically Signed   By: Jeannine Boga M.D.   On: 06/18/2014 14:00   US Abdomen Complete  06/18/2014   CLINICAL DATA:  Upper abdominal pain, elevated LFTs  EXAM: ULTRASOUND ABDOMEN COMPLETE  COMPARISON:  None.  FINDINGS: Gallbladder:  Surgically absent  Common bile duct:  Diameter: CBD dilatation with CBD measuring 18.3 mm.  Liver:  There is heterogeneous liver with intrahepatic biliary ductal dilatation. Hypoechoic cystic lesion in right hepatic lobe centrally measures 1.7 x 1.5 cm.  IVC:  No abnormality visualized.  Pancreas:  Limited assessment due to abundant bowel gas  Spleen:  Size and appearance within normal limits.  Right Kidney:  Length: 11 cm. Echogenicity within normal limits. No mass or hydronephrosis visualized.  Left Kidney:  Length:  12.9 cm. Echogenicity within normal limits. No mass or hydronephrosis visualized.  Bilateral renal cortical thinning probable due to atrophy.  Abdominal aorta:  No aneurysm visualized.  Measures up to 2.7 cm in diameter.  Other findings:  None.  IMPRESSION: 1. Surgically absent gallbladder.  CBD dilatation up to 1.8 cm. 2. There is intrahepatic  biliary ductal dilatation. Cystic lesion in right hepatic lobe centrally measures 1.7 x 1.5 cm. Further correlation with enhanced CT or MRI could be performed. 3. Bilateral renal cortical thinning probable due to atrophy. No hydronephrosis. No focal renal mass. 4. No aortic aneurysm.   Electronically Signed   By: Lahoma Crocker M.D.   On: 06/18/2014 16:18    ROS:  As stated above in the HPI otherwise negative.  Blood pressure 149/70, pulse 63, temperature 98 F (36.7 C), temperature source Oral, resp. rate 14, SpO2 90.00%.    PE: Gen: NAD, Alert and Oriented HEENT:  [place]/AT, EOMI Neck: Supple, no LAD Lungs: CTA Bilaterally CV: RRR without M/G/R ABM: Soft, tender in the epigastric region, +BS Ext: No C/C/E  Assessment/Plan: 1) Probable gallstone pancreatitis. 2) Hypercoagulable state on anticoagulation. 3) DM. 4) HTN.   Her clinical presentation is very consistent with a gallstone pancreatitis.  I do not know if she passed a stone or if she still has a retained stone.  Regardless she does require an ERCP.  Even if she does not have any ductal stones, she is still at risk for a recurrence in the future and a sphincterotomy can reduce this chance.  I discussed the procedure with the patient and the risks of the procedure such as bleeding, infection, perforation, and pancreatitis.  She is agreeable to undergoing the procedure.  With her anticoagulation Eliquis takes 24 hours to get out of her system, but a sphincterotomy will place her at risk for significant bleeding.  This can be a difficult area to control bleeding if bleeding occurs.  I would prefer 48 hours before attempting the ERCP.  Plan: 1) ERCP Thursday afternoon. 2) Hold Eliquis. 3) Bridging therapy per Hospitalist. 4) Continue with pain control. 5) NPO for now.  Wendy Meza D 06/18/2014, 5:59 PM

## 2014-06-18 NOTE — ED Notes (Signed)
Faxed paperwork to [facility]

## 2014-06-18 NOTE — ED Notes (Signed)
MD made aware that HR decreased to 39. Remains at 41 bpm at this time.

## 2014-06-18 NOTE — Progress Notes (Addendum)
ANTICOAGULATION CONSULT NOTE - Initial Consult  Pharmacy Consult for Heparin Indication: atrial fibrillation  Allergies  Allergen Reactions  . Polysporin [Bacitracin-Polymyxin B] Other (See Comments)    unknown    Patient Measurements: Weight: 140 lb 14 oz (63.9 kg)  Vital Signs: Temp: 97.8 F (36.6 C) (08/11 1955) Temp src: Oral (08/11 1955) BP: 160/90 mmHg (08/11 2030) Pulse Rate: 68 (08/11 2030)  Labs:  Recent Labs  06/18/14 1333 06/18/14 1358  HGB 12.2 12.6  HCT 36.4 37.0  PLT 123*  --   CREATININE 0.85 1.00    CrCl is unknown because there is no height on file for the current visit.   Medical History: Past Medical History  Diagnosis Date  . Hypertension   . Diabetes mellitus without complication   . Cancer   . A-fib   . Pancreatitis     Medications:  Prescriptions prior to admission  Medication Sig Dispense Refill  . CRESTOR 20 MG tablet Take 20 mg by mouth at bedtime.       Marland Kitchen ELIQUIS 5 MG TABS tablet Take 5 mg by mouth 2 (two) times daily.      . furosemide (LASIX) 40 MG tablet Take 40 mg by mouth daily.      Marland Kitchen HUMALOG KWIKPEN 100 UNIT/ML KiwkPen Inject 10 Units into the skin daily as needed (takes 10 units if CBG > 160).       Marland Kitchen KLOR-CON M20 20 MEQ tablet Take 20 mEq by mouth 2 (two) times daily.      Marland Kitchen LANTUS SOLOSTAR 100 UNIT/ML Solostar Pen Inject 30 Units into the skin at bedtime.      Marland Kitchen lisinopril (PRINIVIL,ZESTRIL) 40 MG tablet Take 20 mg by mouth 2 (two) times daily.       . metFORMIN (GLUCOPHAGE) 500 MG tablet Take 500 mg by mouth 2 (two) times daily.      . metoprolol (LOPRESSOR) 50 MG tablet Take 25 mg by mouth every morning.       . ranitidine (ZANTAC) 150 MG tablet Take 150 mg by mouth 2 (two) times daily.      . sotalol (BETAPACE) 80 MG tablet Take 80 mg by mouth 2 (two) times daily.       Marland Kitchen NITROSTAT 0.4 MG SL tablet Place 0.4 mg under the tongue every 5 (five) minutes as needed.        Assessment: 73 yo F admitted 06/18/2014 with  acute pancreatitis.  Pharmacy consulted to dose heparin as a bridge off of apixaban pending ERCP.  PMH: DM, HTN, Afib, Abdominal clot  Coag: Afib, recent abdominal clot s/p surgery 6/15.  Patient reports taking apixaban this am.    Goal of Therapy:  Heparin level 0.3-0.7 units/ml Monitor platelets by anticoagulation protocol: Yes   Plan:  STAT Baseline Xa level, PTT and INR prior to heparin start; evaluate for need to monitor heparin with PTTs Give NONE units bolus x 1 Start heparin infusion at 950 units/hr Check anti-Xa level in 6 hours and daily while on heparin Continue to monitor H&H and platelets  Thank you for allowing pharmacy to be a part of this patients care team.  Rowe Robert Pharm.D., BCPS, AQ-Cardiology Clinical Pharmacist 06/18/2014 9:18 PM Pager: [phone] Phone: [phone]

## 2014-06-18 NOTE — ED Provider Notes (Signed)
PROGRESS NOTE                                                                                                                 This is a sign-out from [place]  at shift change: Wendy Meza is a 73 y.o. female with past medical history significant for non-insulin-dependent diabetes, hypertension, angina, A. fib anticoagulated with Eliquis, ischemic bowel and splenic infarct presenting with l epigastric pain that radiates to the chest. Cardiac workup was negative. Associated symptoms of nausea and vomiting. Patient has a history of pancreatitis and states this feels similar.  Heart is a regular rate and rhythm with no murmurs rubs or gallops, lung sounds are clear to auscultation bilaterally, abdominal exam is benign with tenderness palpation of the epigastrium and no guarding or rebound, normoactive bowel sounds. Reinforced n.p.o. status   PA Jerline Pain has consulted gastroenterology and Dr. Almyra Free will consult, he asked for medical admission.  Case d/w Resident Delfino Lovett of Internal medicine teaching service. Patient will be admitted to a telemetry bed under the care of attending physician Lorenda Hatchet Please refer to previous note for full HPI, ROS, PMH and PE.     Monico Blitz, PA-C 06/18/14 1759

## 2014-06-19 ENCOUNTER — Encounter (HOSPITAL_COMMUNITY): Payer: Self-pay | Admitting: Oncology

## 2014-06-19 DIAGNOSIS — D649 Anemia, unspecified: Secondary | ICD-10-CM

## 2014-06-19 DIAGNOSIS — I10 Essential (primary) hypertension: Secondary | ICD-10-CM

## 2014-06-19 DIAGNOSIS — D735 Infarction of spleen: Secondary | ICD-10-CM

## 2014-06-19 DIAGNOSIS — D696 Thrombocytopenia, unspecified: Secondary | ICD-10-CM

## 2014-06-19 DIAGNOSIS — R748 Abnormal levels of other serum enzymes: Secondary | ICD-10-CM

## 2014-06-19 DIAGNOSIS — K766 Portal hypertension: Secondary | ICD-10-CM

## 2014-06-19 DIAGNOSIS — K55069 Acute infarction of intestine, part and extent unspecified: Secondary | ICD-10-CM

## 2014-06-19 DIAGNOSIS — I4891 Unspecified atrial fibrillation: Secondary | ICD-10-CM

## 2014-06-19 DIAGNOSIS — R1013 Epigastric pain: Secondary | ICD-10-CM | POA: Diagnosis present

## 2014-06-19 DIAGNOSIS — K769 Liver disease, unspecified: Secondary | ICD-10-CM

## 2014-06-19 DIAGNOSIS — R160 Hepatomegaly, not elsewhere classified: Secondary | ICD-10-CM

## 2014-06-19 DIAGNOSIS — I81 Portal vein thrombosis: Principal | ICD-10-CM

## 2014-06-19 DIAGNOSIS — D61818 Other pancytopenia: Secondary | ICD-10-CM

## 2014-06-19 DIAGNOSIS — D7389 Other diseases of spleen: Secondary | ICD-10-CM

## 2014-06-19 HISTORY — DX: Portal vein thrombosis: I81

## 2014-06-19 HISTORY — DX: Other pancytopenia: D61.818

## 2014-06-19 HISTORY — DX: Unspecified atrial fibrillation: I48.91

## 2014-06-19 HISTORY — DX: Acute infarction of intestine, part and extent unspecified: K55.069

## 2014-06-19 HISTORY — DX: Infarction of spleen: D73.5

## 2014-06-19 HISTORY — DX: Portal hypertension: K76.6

## 2014-06-19 HISTORY — DX: Hepatomegaly, not elsewhere classified: R16.0

## 2014-06-19 HISTORY — DX: Essential (primary) hypertension: I10

## 2014-06-19 LAB — GLUCOSE, CAPILLARY
GLUCOSE-CAPILLARY: 284 mg/dL — AB (ref 70–99)
Glucose-Capillary: 147 mg/dL — ABNORMAL HIGH (ref 70–99)
Glucose-Capillary: 120 mg/dL — ABNORMAL HIGH (ref 70–99)
Glucose-Capillary: 174 mg/dL — ABNORMAL HIGH (ref 70–99)
Glucose-Capillary: 280 mg/dL — ABNORMAL HIGH (ref 70–99)

## 2014-06-19 LAB — APTT
aPTT: >200 seconds (ref 24–37)
aPTT: 34 seconds (ref 24–37)
aPTT: 66 seconds — ABNORMAL HIGH (ref 24–37)

## 2014-06-19 LAB — CBC WITH DIFFERENTIAL/PLATELET
BASOS ABS: 0 10*3/uL (ref 0.0–0.1)
BASOS PCT: 0 % (ref 0–1)
Eosinophils Absolute: 0.1 10*3/uL (ref 0.0–0.7)
Eosinophils Relative: 4 % (ref 0–5)
HEMATOCRIT: 29.2 % — AB (ref 36.0–46.0)
HEMOGLOBIN: 9.9 g/dL — AB (ref 12.0–15.0)
LYMPHS PCT: 24 % (ref 12–46)
Lymphs Abs: 0.7 10*3/uL (ref 0.7–4.0)
MCH: 29.4 pg (ref 26.0–34.0)
MCHC: 33.9 g/dL (ref 30.0–36.0)
MCV: 86.6 fL (ref 78.0–100.0)
MONO ABS: 0.3 10*3/uL (ref 0.1–1.0)
Monocytes Relative: 10 % (ref 3–12)
NEUTROS ABS: 1.7 10*3/uL (ref 1.7–7.7)
NEUTROS PCT: 62 % (ref 43–77)
Platelets: 58 10*3/uL — ABNORMAL LOW (ref 150–400)
RBC: 3.37 MIL/uL — ABNORMAL LOW (ref 3.87–5.11)
RDW: 14.3 % (ref 11.5–15.5)
WBC: 2.7 10*3/uL — AB (ref 4.0–10.5)

## 2014-06-19 LAB — HEPATIC FUNCTION PANEL
ALBUMIN: 3.3 g/dL — AB (ref 3.5–5.2)
ALT: 787 U/L — ABNORMAL HIGH (ref 0–35)
AST: 1217 U/L — ABNORMAL HIGH (ref 0–37)
Alkaline Phosphatase: 186 U/L — ABNORMAL HIGH (ref 39–117)
Bilirubin, Direct: 1.4 mg/dL — ABNORMAL HIGH (ref 0.0–0.3)
Indirect Bilirubin: 2.6 mg/dL — ABNORMAL HIGH (ref 0.3–0.9)
Total Bilirubin: 4 mg/dL — ABNORMAL HIGH (ref 0.3–1.2)
Total Protein: 6.1 g/dL (ref 6.0–8.3)

## 2014-06-19 LAB — SAVE SMEAR

## 2014-06-19 LAB — CBC
HCT: 25.7 % — ABNORMAL LOW (ref 36.0–46.0)
Hemoglobin: 8.8 g/dL — ABNORMAL LOW (ref 12.0–15.0)
MCH: 29 pg (ref 26.0–34.0)
MCHC: 34.2 g/dL (ref 30.0–36.0)
MCV: 84.8 fL (ref 78.0–100.0)
Platelets: 54 K/uL — ABNORMAL LOW (ref 150–400)
RBC: 3.03 MIL/uL — ABNORMAL LOW (ref 3.87–5.11)
RDW: 14.2 % (ref 11.5–15.5)
WBC: 2.4 K/uL — ABNORMAL LOW (ref 4.0–10.5)

## 2014-06-19 LAB — HIV ANTIBODY (ROUTINE TESTING W REFLEX): HIV 1&2 Ab, 4th Generation: NONREACTIVE

## 2014-06-19 LAB — HEPATITIS PANEL, ACUTE
HCV Ab: NEGATIVE
Hep A IgM: NONREACTIVE
Hep B C IgM: NONREACTIVE
Hepatitis B Surface Ag: NEGATIVE

## 2014-06-19 LAB — ANTITHROMBIN III: AntiThromb III Func: 69 % — ABNORMAL LOW (ref 75–120)

## 2014-06-19 LAB — LIPID PANEL
CHOL/HDL RATIO: 1.6 ratio
Cholesterol: 73 mg/dL (ref 0–200)
HDL: 47 mg/dL (ref >39)
LDL CALC: 11 mg/dL (ref 0–99)
TRIGLYCERIDES: 73 mg/dL (ref <150)
VLDL: 15 mg/dL (ref 0–40)

## 2014-06-19 LAB — D-DIMER, QUANTITATIVE: D-Dimer, Quant: 4.13 ug{FEU}/mL — ABNORMAL HIGH (ref 0.00–0.48)

## 2014-06-19 LAB — FIBRINOGEN: Fibrinogen: 276 mg/dL (ref 204–475)

## 2014-06-19 LAB — HEPARIN LEVEL (UNFRACTIONATED): Heparin Unfractionated: >2.2 [IU]/mL — ABNORMAL HIGH (ref 0.30–0.70)

## 2014-06-19 LAB — LACTIC ACID, PLASMA: LACTIC ACID, VENOUS: 2.3 mmol/L — AB (ref 0.5–2.2)

## 2014-06-19 MED ORDER — LISINOPRIL 20 MG PO TABS
20.0000 mg | ORAL_TABLET | Freq: Two times a day (BID) | ORAL | Status: DC
  Administered 2014-06-19 – 2014-06-24 (×11): 20 mg via ORAL
  Filled 2014-06-19 (×12): qty 1

## 2014-06-19 MED ORDER — SODIUM CHLORIDE 0.9 % IV SOLN
INTRAVENOUS | Status: DC
  Administered 2014-06-20 (×2): via INTRAVENOUS

## 2014-06-19 MED ORDER — ARGATROBAN 50 MG/50ML IV SOLN
1.0000 ug/kg/min | INTRAVENOUS | Status: DC
  Administered 2014-06-19: 1 ug/kg/min via INTRAVENOUS
  Filled 2014-06-19: qty 50

## 2014-06-19 MED ORDER — RANITIDINE HCL 150 MG/10ML PO SYRP
150.0000 mg | ORAL_SOLUTION | Freq: Two times a day (BID) | ORAL | Status: DC
  Administered 2014-06-19 – 2014-06-20 (×4): 150 mg via ORAL
  Filled 2014-06-19 (×6): qty 10

## 2014-06-19 MED ORDER — METOPROLOL TARTRATE 25 MG PO TABS
25.0000 mg | ORAL_TABLET | Freq: Every day | ORAL | Status: DC
  Administered 2014-06-19: 25 mg via ORAL
  Filled 2014-06-19: qty 1

## 2014-06-19 MED ORDER — SOTALOL HCL 80 MG PO TABS
80.0000 mg | ORAL_TABLET | Freq: Two times a day (BID) | ORAL | Status: DC
  Administered 2014-06-19 – 2014-06-23 (×10): 80 mg via ORAL
  Filled 2014-06-19 (×13): qty 1

## 2014-06-19 MED ORDER — METOPROLOL TARTRATE 25 MG PO TABS
25.0000 mg | ORAL_TABLET | Freq: Two times a day (BID) | ORAL | Status: DC
  Administered 2014-06-20: 25 mg via ORAL
  Filled 2014-06-19 (×2): qty 1

## 2014-06-19 MED ORDER — SODIUM CHLORIDE 0.9 % IV SOLN
0.1000 mg/kg/h | INTRAVENOUS | Status: DC
  Administered 2014-06-19 – 2014-06-23 (×3): 0.1 mg/kg/h via INTRAVENOUS
  Filled 2014-06-19 (×3): qty 250

## 2014-06-19 NOTE — Progress Notes (Signed)
I have reviewed the patients chart concerning the current findings of the CT scan as well as the acute pancytopenia.  The CT scan is negative for any evidence of pancreatitis and there is a question of malignancy.  Her liver enzymes have markedly increased, but clinically she is reportedly better.  I am uncertain if the enzyme elevations are associated with any obstructive issues with her CBD versus another source.  The increase in TB may be associated with DIC, but Dr. Beryle Beams does not feel that this is the case.    I will still keep the patient on the schedule for tomorrow, just in case.  But, it is appearing less likely that she will need an ERCP at this time with the current findings.  I will follow up on the liver panel in the AM.

## 2014-06-19 NOTE — Consult Note (Signed)
Referring MD: Dr. Karlene Lineman  PCP:  Default, Provider, MD   Reason for Referral: Complex coagulopathy and thrombocytopenia   Chief Complaint  Patient presents with  . Chest Pain  . Abdominal Pain    HPI:  73 year old Caucasian woman who moved from [place] to [place] about 6 years ago. She's been in overall excellent health until she moved to [place]. 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 She underwent emergency surgery. She was anticoagulated and likely received heparin. She was sent home on apixiban. Complete records not available at time of this dictation but it appears that she was recently diagnosed with atrial fibrillation around the same time that she required the emergency surgery. She was not told that she had any defects in her heart such as a PFO. She has treated hypertension. She is a type II diabetic and was on both oral agents and insulin.  She had a cholecystectomy in the late 1980s. In 2002 she had a hospital admission for pancreatitis. She has had no recurrent abdominal events until the June hospital admission. She denies any prior history of ulcer disease, hepatitis, yellow jaundice, mononucleosis, or malaria. She did require blood transfusions with recent surgery but not, to her knowledge,  in the past. She now presents again with acute onset of severe upper abdominal pain, nausea, and vomiting. No hematochezia.  Laboratory studies on admission showed liver function abnormalities with disproportionate elevation of transaminases compared with bilirubin and alkaline phosphatase. SGOT 357, SGPT 135, delivered and 2.2, alkaline phosphatase 127. Serum lipase elevated over 3000  units.  CT scan of the abdomen was done and I personally reviewed the images with both invasive and a noninvasive radiologist. 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 Extensive collateral flow has developed and there are large mesenteric varices. Pancreatic head is visualized but the remainder of the pancreas is not and this appears to be a chronic finding compared with the report from the June 5 study done in [place]. There is no radiographic evidence for acute pancreatitis. There are at least 2 distinct soft tissue densities: One against the anterior abdominal wall irregular in shape and appears to have a rim of fatty tissue around it. The other is in the midline right over a surgical clip and may represent a seroma. There are multiple, small, in my opinion, atypical filling defects in the liver. No abnormalities noted on the liver from the June 5 study. There is some dilatation of intrahepatic bile ducts.  Pending further evaluation, the patient was started on parenteral unfractionated heparin and the apixiban was held. She was given aggressive hydration and put at bowel rest.  Overnight there has been a significant change in her CBC. She has developed acute pancytopenia:  hemoglobin fell from 12.2 to 8.8, white count of 6900 with 80% neutrophils down to 2400, platelet count fell from 123,000-54,000. No signs of acute bleeding. Other than some prn  pain medications, including fentanyl and Dilaudid, the only medication she has received in the hospital is heparin.  She appears clinically stable and her abdominal pain has almost completely resolved with conservative treatment.   Past Medical History  Diagnosis Date  . Hypertension   . A-fib   . Pancreatitis  2002; 06/18/2014  . High cholesterol   . Type II diabetes mellitus   . Anemia   . History of blood transfusion 2011-04/2014    "couple times related to OR on my legs; twice when I had blood clot in my stomach"  .  Squamous carcinoma     "legs", S/P radiation; chemo; OR  : No thyroid disease. No MI. No history of seizure or stroke. No inflammatory arthritis.   Past Surgical History  Procedure Laterality Date  . Umbilical hernia repair  04/2014  . Tonsillectomy  1940's  . Cholecystectomy  1980"s  . Appendectomy  1980's  . Hernia repair    . Colon surgery  04/2014    "blood clot in stomach due to AF; they cut it out/fixed it"  . Squamous cell carcinoma excision Bilateral 2011-2015; multiple ORs    "legs"  . Cosmetic surgery Bilateral 2011-2015; multiple times    "legs"  : Current facility-administered medications:0.9 %  sodium chloride infusion, , Intravenous, Continuous, Clinton Gallant, MD, Last Rate: 75 mL/hr at 06/19/14 0552;  0.9 %  sodium chloride infusion, , Intravenous, Continuous, Beryle Beams, MD;  HYDROmorphone (DILAUDID) injection 0.5 mg, 0.5 mg, Intravenous, Q3H PRN, Clinton Gallant, MD insulin aspart (novoLOG) injection 0-15 Units, 0-15 Units, Subcutaneous, 6 times per day, Jacques Earthly, MD, 3 Units at 06/19/14 1256;  ondansetron (ZOFRAN) injection 4 mg, 4 mg, Intravenous, Q6H PRN, Clinton Gallant, MD;  ondansetron [facility]) tablet 4 mg, 4 mg, Oral, Q6H PRN, Clinton Gallant, MD;  sodium chloride 0.9 % injection 3 mL, 3 mL, Intravenous, Q12H, Clinton Gallant, MD, 3 mL at 06/18/14 2114  . insulin aspart  0-15 Units Subcutaneous 6 times per day  . sodium chloride  3 mL Intravenous Q12H  :  Allergies  Allergen Reactions  . Polysporin [Bacitracin-Polymyxin B] Other (See Comments)    unknown  :  History reviewed. No pertinent family history.:  History   Social History  . Marital Status: Married    Spouse Name: N/A    Number of Children:  2   . Years of Education: N/A   Occupational History  . Not on file.   Social History Main Topics  . Smoking status: Former Smoker -- 0.25 packs/day for 20 years    Types: Cigarettes  . Smokeless tobacco: Never Used     Comment: 06/18/2014 "quit smoking 30-40 yrs  ago"  . Alcohol Use: 3.0 oz/week    5 Glasses of wine per week  . Drug Use: No  . Sexual Activity: No   Other Topics Concern  . Not on file   Social History Narrative  . No narrative on file  :  ROS: Eyes: No headache or change in vision Throat: No sore throat Neck: Resp:  No cough or dyspnea Cardio: No chest pain or palpitations GI: See history of present illness Extremities: See history of present illness Lymph nodes:  Neurologic:  See eye exam Skin: . No new skin lesions Genitourinary: Not questioned   Vitals: Filed Vitals:   06/19/14 0426  BP: 171/60  Pulse: 60  Temp: 98.3 F (36.8 C)  Resp: 18    PHYSICAL EXAM: General appearance: Well-nourished Caucasian woman no distress HEENT: Pharynx no erythema or exudate. Neck full range of motion. Lymph Nodes: No cervical, supraclavicular, axillary, or inguinal lymphadenopathy Resp: Lungs clear to auscultation and resonant to percussion Cardio: Regular cardiac rhythm no murmur gallop or rub Vascular: Carotids 2+ no bruits. Dorsalis pedis pulses 2+. Breasts: GI: Abdomen is  soft, minimally tender in the epigastric region, no palpable mass or organomegaly GU: Extremities: Extensive changes on the shins bilaterally from removal of squamous cell carcinomas with skin grafting Neurologic: She is alert and oriented. Full extraocular movements. PERRLA. Motor strength 5 over 5. Reflexes 1+ symmetric. Upper body coordination normal. Gait not tested Skin: No rash or ecchymoses  Labs:   Recent Labs  06/18/14 1333 06/18/14 1358 06/19/14 0706  WBC 6.9  --  2.4*  HGB 12.2 12.6 8.8*  HCT 36.4 37.0 25.7*  PLT 123*  --  54*    Recent Labs  06/18/14 1333 06/18/14 1358  NA 140 138  K 4.7 4.3  CL 101 103  CO2 22  --   GLUCOSE 392* 405*  BUN 18 21  CREATININE 0.85 1.00  CALCIUM 9.2  --     Blood smear review: Normochromic normocytic red cells. No polychromasia. No schistocytes. Decreased total white cells but  neutrophils appear mature. No toxic granulations or Dohle bodies. Platelets are decreased in #3-4 per high power field which correlates with the machine count.  Images Studies/Results:  Dg Chest 2 View  06/18/2014   CLINICAL DATA:  Chest pain  EXAM: CHEST  2 VIEW  COMPARISON:  None.  FINDINGS: The cardiac and mediastinal silhouettes are stable in size and contour, and remain within normal limits.  The lungs are normally inflated. No airspace consolidation, pleural effusion, or pulmonary edema is identified. There is no pneumothorax.  No acute osseous abnormality identified. Minimal degenerative spurring noted within the mid thoracic spine. Surgical clips overlie the left axilla.  IMPRESSION: No active cardiopulmonary disease.   Electronically Signed   By: Jeannine Boga M.D.   On: 06/18/2014 14:00   US Abdomen Complete  06/18/2014   CLINICAL DATA:  Upper abdominal pain, elevated LFTs  EXAM: ULTRASOUND ABDOMEN COMPLETE  COMPARISON:  None.  FINDINGS: Gallbladder:  Surgically absent  Common bile duct:  Diameter: CBD dilatation with CBD measuring 18.3 mm.  Liver:  There is heterogeneous liver with intrahepatic biliary ductal dilatation. Hypoechoic cystic lesion in right hepatic lobe centrally measures 1.7 x 1.5 cm.  IVC:  No abnormality visualized.  Pancreas:  Limited assessment due to abundant bowel gas  Spleen:  Size and appearance within normal limits.  Right Kidney:  Length: 11 cm. Echogenicity within normal limits. No mass or hydronephrosis visualized.  Left Kidney:  Length: 12.9 cm. Echogenicity within normal limits. No mass or hydronephrosis visualized.  Bilateral renal cortical thinning probable due to atrophy.  Abdominal aorta:  No aneurysm visualized.  Measures up to 2.7 cm in diameter.  Other findings:  None.  IMPRESSION: 1. Surgically absent gallbladder.  CBD dilatation up to 1.8 cm. 2. There is intrahepatic biliary ductal dilatation. Cystic lesion in right hepatic lobe centrally measures 1.7 x  1.5 cm. Further correlation with enhanced CT or MRI could be performed. 3. Bilateral renal cortical thinning probable due to atrophy. No hydronephrosis. No focal renal mass. 4. No aortic aneurysm.   Electronically Signed   By: Lahoma Crocker M.D.   On: 06/18/2014 16:18   Ct Abdomen Pelvis W Contrast  06/19/2014   CLINICAL DATA:  Abdominal pain. History of pancreatitis, diabetes and hypertension.  EXAM: CT ABDOMEN AND PELVIS WITH CONTRAST  TECHNIQUE: Multidetector CT imaging of the abdomen and pelvis was performed using the standard protocol following bolus administration of intravenous contrast.  CONTRAST:  80 ml Omnipaque 300.  COMPARISON:  Abdominal ultrasound same date.  FINDINGS: Lung bases: Clear. No  significant pleural or pericardial effusion.  Liver/Biliary/Pancreas: The gallbladder is surgically absent. There is moderate intra and extrahepatic biliary dilatation. The common hepatic duct measures up to 1.7 cm in diameter. The common bile duct tapers distally within the pancreatic head. No calcified intraductal stone or pancreatic mass is identified. There is no evidence of pancreatitis, pancreatic ductal dilatation or surrounding fluid collection. The pancreatic head appears normal. The pancreatic body and tail are markedly diminutive. Multiple ill-defined low-density lesions are present throughout the right hepatic lobe. There is no well-defined mass.  Spleen/Adrenal glands: The spleen is enlarged with mildly heterogeneous density. No focal lesions are identified. There is no adrenal mass.  Kidneys/Ureters/Bladder: There is a small nonobstructing calculus in the mid right kidney. No other urinary tract calculi are demonstrated. There is no evidence of renal mass, hydronephrosis or bladder lesion.  Bowel/Peritoneum: There is diffuse gastric wall thickening, especially in the antrum. No small bowel abnormalities are identified. The appendix is not visualized. There is questionable circumferential wall  thickening of the distal transverse colon (axial image 42). There is no evidence of associated bowel obstruction. However, there is an adjacent bilobed mass measuring up to 3.5 x 3.2 cm transverse on image 38. This has fat density along its periphery and could reflect an atypical area of fat necrosis. Within the base the mesentery, there is a low-density structure measuring 2.3 x 2.7 cm. This is adjacent to a surgical clip and could reflect a postoperative finding. There is no ascites.  Retroperitoneum/Pelvis: There are no enlarged abdominal or pelvic lymph nodes. There is mild aortoiliac atherosclerosis. 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 In addition, the splenic vein appears chronically occluded. There are numerous left upper quadrant abdominal varices. In addition, there are multiple large varices of the right ovarian vein. No adnexal mass is seen. There is a small calcified uterine fibroid.  Abdominal wall: Postsurgical changes are present within the anterior abdominal wall. There is no evidence of abdominal wall mass or hernia.  Musculoskeletal: No acute or significant osseus findings.  IMPRESSION: 1. No evidence of acute pancreatitis. The pancreatic body and tail are markedly diminutive, possibly secondary to prior pancreatitis or congenital variant. 2. 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 3. Omental/mesenteric masses concerning for malignancy. In addition, there are multiple ill-defined low-density lesions distributed throughout the right hepatic lobe which are associated with intra and extrahepatic biliary dilatation. Infiltrating hepatic malignancy is a consideration. 4. Abdominal MRI without and with contrast may be helpful to further evaluate the liver. Alternatively, PET-CT could be performed to assess for metabolic activity within the liver and mesenteric lesions.   Electronically Signed    By: Camie Patience M.D.   On: 06/19/2014 07:44      Impression: Complex situation with multiple components: #1. Acute onset of pancytopenia with only medication given in the hospital other than pain medication being heparin. She was likely sensitized to heparin in June when she had an acute thrombotic event and was hospitalized. Although the etiology of the thrombocytopenia may be related to the heparin, it appears to be a more atypical drug reaction in view of pancytopenia and not the usual isolated thrombocytopenia that one would see with a heparin-platelet factor-4 antibody formation. In general, heparin antibodies extinguish approximately 3 months after initial development. She is now out 2 months from the surgery and still in the window where immediate heparin hypersensitivity might be seen on rechallenge. Another consideration in the  differential diagnosis would be early DIC. I do not see any micro-angiopathic changes on review of the peripheral blood film ( no schistocytes) or any toxic changes in the neutrophils but I am going to go ahead and check a d-dimer and a fibrinogen level. Recommendation: In view of the recent major thrombotic event and at least an intermediate probability that we are now seeing heparin related thrombocytopenia, I do think we need to cover her with a parenteral direct thrombin inhibitor (Argatroban) pending the results of a heparin antibody panel. Argatroban can be monitored with the PTT. It has a half-life of minutes and can be stopped if she needs a biopsy procedure.  #2. Recurrent abdominal pain, elevated lipase, 2 months following superior mesenteric thrombosis with additional evidence on current scan for splenic and portal vein thrombosis and interval development of extensive collateral venous circulation but no radiographic evidence of acute pancreatitis. All of this this occurred while the patient was on apixiban. Not clear whether or not she has had additional  ischemic tissue damage to explain her pain and the elevated lipase but clinically this appears to be self-limited since symptoms are resolving rapidly and there are no signs of acute ischemia on current exam or radiographic studies.  #3. Multiple liver lesions and at least 2 intra-abdominal soft tissue masses versus postsurgical change/seromas and areas of fat  Necrosis. Radiologist feels that these changes are suspicious for hepatocellular carcinoma. Given negative CT scan just 2 months ago, I would be more concerned that we're dealing with hepatic abscess formation or areas of hepatic infarction than malignancy. Recommendation: Radiologist recommends MRI of the liver with contrast for better definition of the liver pathology. If there are ongoing concerns for malignancy, then a liver biopsy would be indicated. I agree with checking  alpha-fetoprotein tumor marker. Although not diagnostic, if extremely elevated it would support a diagnosis of hepatocellular carcinoma.  #4. Recent superior mesenteric vascular thrombosis This is not the typical distribution of thrombi from a cardiac source and I believe we need to check for possible underlying congenital coagulopathy such as protein S, C., or antithrombin deficiencies and antiphospholipid antibody syndrome. Underlying malignancy particularly adenocarcinoma is another association. I am not convinced that this event was related to new onset atrial fibrillation, she had a PFO or ASD.  #5. Recent onset atrial fibrillation See. #4 above  #6. Status post extensive surgery to remove squamous cell carcinomas from both bilateral lower extremities.   My impressions above discussed with attending physician and medical residents. Thank you for this consultation. I will follow with interest.      Aowyn Rozeboom M 06/19/2014, 1:35 PM

## 2014-06-19 NOTE — Progress Notes (Addendum)
ANTICOAGULATION CONSULT NOTE - Initial Consult  Pharmacy Consult for argatroban Indication: atrial fibrillation, eliquis on hold  Allergies  Allergen Reactions  . Polysporin [Bacitracin-Polymyxin B] Other (See Comments)    unknown    Patient Measurements: Height: 5\' 1"  (154.9 cm) Weight: 140 lb 14 oz (63.9 kg) IBW/kg (Calculated) : 47.8   Vital Signs: Temp: 98.3 F (36.8 C) (08/12 0426) Temp src: Oral (08/12 0426) BP: 171/60 mmHg (08/12 0426) Pulse Rate: 60 (08/12 0426)  Labs:  Recent Labs  06/18/14 1333 06/18/14 1358 06/18/14 2147 06/19/14 0706  HGB 12.2 12.6  --  8.8*  HCT 36.4 37.0  --  25.7*  PLT 123*  --   --  54*  APTT  --   --   --  >200*  LABPROT  --   --  22.8*  --   INR  --   --  2.01*  --   HEPARINUNFRC  --   --  >2.20* >2.20*  CREATININE 0.85 1.00  --   --     Estimated Creatinine Clearance: 44.2 ml/min (by C-G formula based on Cr of 1).   Medical History: Past Medical History  Diagnosis Date  . Hypertension   . A-fib   . Pancreatitis 2002; 06/18/2014  . High cholesterol   . Type II diabetes mellitus   . Anemia   . History of blood transfusion 2011-04/2014    "couple times related to OR on my legs; twice when I had blood clot in my stomach"  . Squamous carcinoma     "legs", S/P radiation; chemo; OR    Medications:  Prescriptions prior to admission  Medication Sig Dispense Refill  . CRESTOR 20 MG tablet Take 20 mg by mouth at bedtime.       Marland Kitchen ELIQUIS 5 MG TABS tablet Take 5 mg by mouth 2 (two) times daily.      . furosemide (LASIX) 40 MG tablet Take 40 mg by mouth daily.      Marland Kitchen HUMALOG KWIKPEN 100 UNIT/ML KiwkPen Inject 10 Units into the skin daily as needed (takes 10 units if CBG > 160).       Marland Kitchen KLOR-CON M20 20 MEQ tablet Take 20 mEq by mouth 2 (two) times daily.      Marland Kitchen LANTUS SOLOSTAR 100 UNIT/ML Solostar Pen Inject 30 Units into the skin at bedtime.      Marland Kitchen lisinopril (PRINIVIL,ZESTRIL) 40 MG tablet Take 20 mg by mouth 2 (two) times  daily.       . metFORMIN (GLUCOPHAGE) 500 MG tablet Take 500 mg by mouth 2 (two) times daily.      . metoprolol (LOPRESSOR) 50 MG tablet Take 25 mg by mouth every morning.       . ranitidine (ZANTAC) 150 MG tablet Take 150 mg by mouth 2 (two) times daily.      . sotalol (BETAPACE) 80 MG tablet Take 80 mg by mouth 2 (two) times daily.       Marland Kitchen NITROSTAT 0.4 MG SL tablet Place 0.4 mg under the tongue every 5 (five) minutes as needed.        Assessment: 73 yo lady admitted with abdominal pain who was started on heparin drip as her eliquis was on hold.  Her platelet count and Hg dropped significantly this am. Plan to r/o HIT and start argatroban as bridge therapy.  We do not have all of her medical history as she is from [place] so unknown if she has had previous exposure  to heparin.  APTT this am was >200. Goal of Therapy:  aPTT 50-90 seconds Monitor platelets by anticoagulation protocol: Yes   Plan:  Will recheck aPTT and start argatroban when aPTT<90 sec. F/u HIT panel. Monitor for bleeding.  Verner Kopischke Poteet 06/19/2014,1:56 PM  Stast argatroban at 1 mcg/kg/min.  Check aPTT 2 hours after start.  Excell Seltzer, PharmD

## 2014-06-19 NOTE — Progress Notes (Signed)
ANTICOAGULATION CONSULT NOTE - Follow Up Consult  Pharmacy Consult for Angiomax >> Bivalirudin Indication: Questionable HIT  Allergies  Allergen Reactions  . Polysporin [Bacitracin-Polymyxin B] Other (See Comments)    unknown    Patient Measurements: Height: 5\' 1"  (154.9 cm) Weight: 140 lb 14 oz (63.9 kg) IBW/kg (Calculated) : 47.8  Vital Signs: Temp: 98.9 F (37.2 C) (08/12 2009) Temp src: Oral (08/12 2009) BP: 167/43 mmHg (08/12 2009) Pulse Rate: 53 (08/12 2009)  Labs:  Recent Labs  06/18/14 1333 06/18/14 1358 06/18/14 2147 06/19/14 0706 06/19/14 1403 06/19/14 1500 06/19/14 1800  HGB 12.2 12.6  --  8.8*  --  9.9*  --   HCT 36.4 37.0  --  25.7*  --  29.2*  --   PLT 123*  --   --  54*  --  58*  --   APTT  --   --   --  >200* 34  --  66*  LABPROT  --   --  22.8*  --   --   --   --   INR  --   --  2.01*  --   --   --   --   HEPARINUNFRC  --   --  >2.20* >2.20*  --   --   --   CREATININE 0.85 1.00  --   --   --   --   --     Estimated Creatinine Clearance: 44.2 ml/min (by C-G formula based on Cr of 1).   Medications:  Argatroban @ 1 mcg/kgmin (3.8 ml/hr)  Assessment: 72 YOF who [facility] the [facility] on [facility] with abdominal pain and acute pancreatitis. The patient was previously on Apixaban for anticoagulation in the setting of Afib - this was held on admission and transitioned to heparin for anticoagulation. Overnight, the patient's platelets had a significant drop by 50% and hematology was consulted. It was recommended to switch the patient to argatroban while ruling out HIT. An aPTT this evening came back therapeutic at 66 seconds (goal of 50-90 seconds).   Upon review of this patient this evening, it was determined to switch the patient from argatroban to bivalirudin for anticoagulation in the setting of possible HIT. Argatroban is hepatically cleared and given the patient's elevations in LFTs - kinetics will be difficult. The half life of argatroban can range from  60-180 minutes with impaired hepatic function.  Bivalirudin will have more stable kinetics while awaiting normalization of the patient's hepatic function. Will plan to make this transition this evening. Will plan to hold argatroban for 1.5 hours - then initiate bivalirudin.   Goal of Therapy:  aPTT 50-85 seconds Monitor platelets by anticoagulation protocol: Yes   Plan:  1. Stop argatroban 2. Transition this patient to bivalirudin at a rate of 0.1 mg/kg/hr (6.39 mg/hr = 12.8 ml/hr) 3. Will obtain an aPTT ~2 hours after starting bivalirudin 4. Will monitor for s/sx of bleeding and HIT test results  Alycia Rossetti, PharmD, BCPS Clinical Pharmacist Pager: [phone] 06/19/2014 8:42 PM

## 2014-06-19 NOTE — Progress Notes (Signed)
UR complete.  Rydell Wiegel RN, MSN 

## 2014-06-19 NOTE — Progress Notes (Signed)
CRITICAL VALUE ALERT  Critical value received:  PTT >200  Date of notification:  06/19/14   Time of notification:  7124  Critical value read back:Yes.    Nurse who received alert:  Glade Nurse  MD notified (1st page):  Marinda Elk, J  Time of first page:  0900  Responding MD:  Glenice Bow  Time MD responded:  [phone]

## 2014-06-19 NOTE — H&P (Signed)
Internal Medicine Attending Admission Note Date: 06/19/2014  Patient name: Wendy Meza Medical record number: [id number] Date of birth: 11/10/1940 Age: 73 y.o. Gender: female  I saw and evaluated the patient. I reviewed the resident's note and I agree with the resident's findings and plan as documented in the resident's note, with the following additional comments.  Chief Complaint(s): Epigastric abdominal pain  History - key components related to admission: Patient is a 73 year old woman with history of diabetes mellitus, hypertension, atrial fibrillation, status post cholecystectomy, pancreatitis, reportedly status post exploratory laparotomy for ischemic bowel in June of this year (records needed to confirm), and other problems as outlined in the medical history, currently visiting [place] from [place], admitted with acute onset of epigastric pain on the day of admission.  Patient reports no recent similar symptoms.  She had some associated nausea and vomiting; she denies hematemesis, bright red blood per rectum, or melena.  She does not drink alcohol.   Physical Exam - key components related to admission:  Filed Vitals:   06/18/14 2334 06/18/14 2351 06/19/14 0426 06/19/14 0834  BP: 180/79 170/85 171/60   Pulse: 62 62 60   Temp:   98.3 F (36.8 C)   TempSrc:   Oral   Resp:   18   Height:    5\' 1"  (1.549 m)  Weight:   140 lb 14 oz (63.9 kg)   SpO2:   92%     General: Alert, oriented, no acute distress Lungs: Clear Heart: Regular; no extra sounds or murmurs Abdomen: Bowel sounds present, soft; moderate midepigastric tenderness; there is a well-healed midline surgical scar  Lab results:   Basic Metabolic Panel:  Recent Labs  06/18/14 1333 06/18/14 1358  NA 140 138  K 4.7 4.3  CL 101 103  CO2 22  --   GLUCOSE 392* 405*  BUN 18 21  CREATININE 0.85 1.00  CALCIUM 9.2  --     Liver Function Tests:  Recent Labs  06/18/14 1333  AST 357*  ALT 135*  ALKPHOS  127*  BILITOT 2.2*  PROT 6.5  ALBUMIN 3.7    Recent Labs  06/18/14 1333  LIPASE >3000*    CBC:  Recent Labs  06/18/14 1333 06/18/14 1358 06/19/14 0706  WBC 6.9  --  2.4*  HGB 12.2 12.6 8.8*  HCT 36.4 37.0 25.7*  MCV 86.9  --  84.8  PLT 123*  --  54*    Recent Labs  06/18/14 1333  NEUTROABS 5.5  LYMPHSABS 1.0  MONOABS 0.2  EOSABS 0.1  BASOSABS 0.0     CBG:  Recent Labs  06/18/14 1303 06/18/14 2111 06/18/14 2332 06/19/14 0352  GLUCAP 384* 313* 221* 147*     Fasting Lipid Panel:  Recent Labs  06/19/14 0815  CHOL 73  HDL 47  LDLCALC 11  TRIG 73  CHOLHDL 1.6     Coagulation:  Recent Labs  06/18/14 2147  INR 2.01*     Urinalysis    Component Value Date/Time   COLORURINE YELLOW 06/18/2014 1918   APPEARANCEUR HAZY* 06/18/2014 1918   LABSPEC 1.009 06/18/2014 1918   PHURINE 6.0 06/18/2014 1918   GLUCOSEU 100* 06/18/2014 1918   HGBUR LARGE* 06/18/2014 1918   BILIRUBINUR NEGATIVE 06/18/2014 1918   KETONESUR NEGATIVE 06/18/2014 1918   PROTEINUR 30* 06/18/2014 1918   UROBILINOGEN 1.0 06/18/2014 1918   NITRITE NEGATIVE 06/18/2014 1918   LEUKOCYTESUR NEGATIVE 06/18/2014 1918    Urine microscopic:  Recent Labs  06/18/14 1918  EPIU FEW*  WBCU 0-2  RBCU 3-6  BACTERIA FEW*  LABCAST HYALINE CASTS*      Imaging results:  Dg Chest 2 View  06/18/2014   CLINICAL DATA:  Chest pain  EXAM: CHEST  2 VIEW  COMPARISON:  None.  FINDINGS: The cardiac and mediastinal silhouettes are stable in size and contour, and remain within normal limits.  The lungs are normally inflated. No airspace consolidation, pleural effusion, or pulmonary edema is identified. There is no pneumothorax.  No acute osseous abnormality identified. Minimal degenerative spurring noted within the mid thoracic spine. Surgical clips overlie the left axilla.  IMPRESSION: No active cardiopulmonary disease.   Electronically Signed   By: Jeannine Boga M.D.   On: 06/18/2014 14:00   US  Abdomen Complete  06/18/2014   CLINICAL DATA:  Upper abdominal pain, elevated LFTs  EXAM: ULTRASOUND ABDOMEN COMPLETE  COMPARISON:  None.  FINDINGS: Gallbladder:  Surgically absent  Common bile duct:  Diameter: CBD dilatation with CBD measuring 18.3 mm.  Liver:  There is heterogeneous liver with intrahepatic biliary ductal dilatation. Hypoechoic cystic lesion in right hepatic lobe centrally measures 1.7 x 1.5 cm.  IVC:  No abnormality visualized.  Pancreas:  Limited assessment due to abundant bowel gas  Spleen:  Size and appearance within normal limits.  Right Kidney:  Length: 11 cm. Echogenicity within normal limits. No mass or hydronephrosis visualized.  Left Kidney:  Length: 12.9 cm. Echogenicity within normal limits. No mass or hydronephrosis visualized.  Bilateral renal cortical thinning probable due to atrophy.  Abdominal aorta:  No aneurysm visualized.  Measures up to 2.7 cm in diameter.  Other findings:  None.  IMPRESSION: 1. Surgically absent gallbladder.  CBD dilatation up to 1.8 cm. 2. There is intrahepatic biliary ductal dilatation. Cystic lesion in right hepatic lobe centrally measures 1.7 x 1.5 cm. Further correlation with enhanced CT or MRI could be performed. 3. Bilateral renal cortical thinning probable due to atrophy. No hydronephrosis. No focal renal mass. 4. No aortic aneurysm.   Electronically Signed   By: Lahoma Crocker M.D.   On: 06/18/2014 16:18   Ct Abdomen Pelvis W Contrast  06/19/2014   CLINICAL DATA:  Abdominal pain. History of pancreatitis, diabetes and hypertension.  EXAM: CT ABDOMEN AND PELVIS WITH CONTRAST  TECHNIQUE: Multidetector CT imaging of the abdomen and pelvis was performed using the standard protocol following bolus administration of intravenous contrast.  CONTRAST:  80 ml Omnipaque 300.  COMPARISON:  Abdominal ultrasound same date.  FINDINGS: Lung bases: Clear. No significant pleural or pericardial effusion.  Liver/Biliary/Pancreas: The gallbladder is surgically absent.  There is moderate intra and extrahepatic biliary dilatation. The common hepatic duct measures up to 1.7 cm in diameter. The common bile duct tapers distally within the pancreatic head. No calcified intraductal stone or pancreatic mass is identified. There is no evidence of pancreatitis, pancreatic ductal dilatation or surrounding fluid collection. The pancreatic head appears normal. The pancreatic body and tail are markedly diminutive. Multiple ill-defined low-density lesions are present throughout the right hepatic lobe. There is no well-defined mass.  Spleen/Adrenal glands: The spleen is enlarged with mildly heterogeneous density. No focal lesions are identified. There is no adrenal mass.  Kidneys/Ureters/Bladder: There is a small nonobstructing calculus in the mid right kidney. No other urinary tract calculi are demonstrated. There is no evidence of renal mass, hydronephrosis or bladder lesion.  Bowel/Peritoneum: There is diffuse gastric wall thickening, especially in the antrum. No small bowel abnormalities are identified. The appendix is not  visualized. There is questionable circumferential wall thickening of the distal transverse colon (axial image 42). There is no evidence of associated bowel obstruction. However, there is an adjacent bilobed mass measuring up to 3.5 x 3.2 cm transverse on image 38. This has fat density along its periphery and could reflect an atypical area of fat necrosis. Within the base the mesentery, there is a low-density structure measuring 2.3 x 2.7 cm. This is adjacent to a surgical clip and could reflect a postoperative finding. There is no ascites.  Retroperitoneum/Pelvis: There are no enlarged abdominal or pelvic lymph nodes. There is mild aortoiliac atherosclerosis. 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 In addition, the splenic vein appears chronically occluded. There are numerous left upper quadrant abdominal varices. In addition, there are  multiple large varices of the right ovarian vein. No adnexal mass is seen. There is a small calcified uterine fibroid.  Abdominal wall: Postsurgical changes are present within the anterior abdominal wall. There is no evidence of abdominal wall mass or hernia.  Musculoskeletal: No acute or significant osseus findings.  IMPRESSION: 1. No evidence of acute pancreatitis. The pancreatic body and tail are markedly diminutive, possibly secondary to prior pancreatitis or congenital variant. 2. 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 3. Omental/mesenteric masses concerning for malignancy. In addition, there are multiple ill-defined low-density lesions distributed throughout the right hepatic lobe which are associated with intra and extrahepatic biliary dilatation. Infiltrating hepatic malignancy is a consideration. 4. Abdominal MRI without and with contrast may be helpful to further evaluate the liver. Alternatively, PET-CT could be performed to assess for metabolic activity within the liver and mesenteric lesions.   Electronically Signed   By: Camie Patience M.D.   On: 06/19/2014 07:44    Other results: EKG: Sinus rhythm; abnormal R-wave progression, early transition; LVH with secondary repolarization abnormality  Assessment & Plan by Problem:  1.  Epigastric abdominal pain, with elevated lipase and CT scan findings as above concerning for malignancy.  Initial presentation was consistent with pancreatitis, but the abdominal CT scan results raise the question of hepatic/intra-abdominal malignancy.  Plan is discuss with Dr. Benson Norway regarding best approach to diagnosis; need to obtain outside records for review and discuss with patient's primary care physician in [place]; check serum AFP.  2.  Thrombocytopenia.  Platelet count dropped overnight from 123 to 54, concerning for possible heparin-induced thrombocytopenia; the fall was acute, but the  patient was hospitalized in June in [place] and we do not know whether she received IV heparin then or not.  Plan is stop heparin; check HIT antibodies; heme/onc consult.  3.  Atrial fibrillation.  Given history that suggests prior embolic events, patient was transitioned to heparin from Eliquis.  However, given concerns about HIT, heparin was stopped.  As above, will get hematology/oncology input regarding anticoagulation.  4.  Anemia.  Hemoglobin dropped from 12.2 on admission to 8.8 today.  Plan is anemia panel; evaluate smear; check reticulocyte count, LDH, and haptoglobin; check stool for evidence of occult blood; monitor hemoglobin closely.  5.  As above, need to get copies of outside records.  6.  Other problems and plans as per the resident physician's note.

## 2014-06-19 NOTE — Progress Notes (Signed)
Subjective:    Currently, the patient reports improvement of her symptoms  She has not had any episodes of nausea or vomiting today, and her epigastric pain has gone away.  She denies bleeding, dizziness, fevers, chills, chest pain, or shortness of breath.  Interval Events: -Abdominal CT showed no acute pancreatitis.  Omental and mesenteric masses were identified concerning for malignancy, along with multiple ill-defined low density lesions in the R hepatic lobe. -These results were communicated with GI who continues to recommend ERCP, scheduled for tomorrow. -Patient's WBC, Hgb, and platelets decreased to 6.9/12.2/123.  Heparin gtt was stopped due to concern for HIT.  Hem/Onc was consulted and recommended argatroban treatment with HIT worked up, also gave recs on further work-up of malignancy/hypercoagulable state. -BP elevated this morning, home BP meds restarted. -Advanced to liquid diet.   Objective:    Vital Signs:   Temp:  [97.8 F (36.6 C)-98.3 F (36.8 C)] 98.3 F (36.8 C) (08/12 0426) Pulse Rate:  [60-68] 60 (08/12 0426) Resp:  [12-18] 18 (08/12 0426) BP: (143-180)/(51-90) 171/60 mmHg (08/12 0426) SpO2:  [90 %-100 %] 92 % (08/12 0426) Weight:  [140 lb 14 oz (63.9 kg)] 140 lb 14 oz (63.9 kg) (08/12 0426) Last BM Date: 06/18/14  24-hour weight change: Weight change:   Intake/Output:   Intake/Output Summary (Last 24 hours) at 06/19/14 1503 Last data filed at 06/19/14 1232  Gross per 24 hour  Intake      0 ml  Output   1700 ml  Net  -1700 ml      Physical Exam: General: Vital signs reviewed and noted. Well-developed, well-nourished, in no acute distress; alert, appropriate and cooperative throughout examination.  Lungs:  Normal respiratory effort. Clear to auscultation BL without crackles or wheezes.  Heart: RRR. S1 and S2 normal without gallop, murmur, or rubs.  Abdomen:  Tender to palpation in epigastric area.  Organomegaly not appreciated on exam. BS  normoactive. Soft, non-distended.  Extremities: No pretibial edema. Skin scars on lower extremities bilaterally with skin graft on left shin.     Labs:  Basic Metabolic Panel:  Recent Labs Lab 06/18/14 1333 06/18/14 1358  NA 140 138  K 4.7 4.3  CL 101 103  CO2 22  --   GLUCOSE 392* 405*  BUN 18 21  CREATININE 0.85 1.00  CALCIUM 9.2  --     Liver Function Tests:  Recent Labs Lab 06/18/14 1333 06/19/14 0805  AST 357* 1217*  ALT 135* 787*  ALKPHOS 127* 186*  BILITOT 2.2* 4.0*  PROT 6.5 6.1  ALBUMIN 3.7 3.3*    Recent Labs Lab 06/18/14 1333  LIPASE >3000*   CBC:  Recent Labs Lab 06/18/14 1333 06/18/14 1358 06/19/14 0706  WBC 6.9  --  2.4*  NEUTROABS 5.5  --   --   HGB 12.2 12.6 8.8*  HCT 36.4 37.0 25.7*  MCV 86.9  --  84.8  PLT 123*  --  54*   CBG:  Recent Labs Lab 06/18/14 2111 06/18/14 2332 06/19/14 0352 06/19/14 0630 06/19/14 1240  GLUCAP 313* 221* 147* 120* 174*   Coagulation Studies:  Recent Labs  06/18/14 2147  LABPROT 22.8*  INR 2.01*    Other results: EKG: normal EKG, normal sinus rhythm, unchanged from previous tracings.  Imaging: Dg Chest 2 View  06/18/2014   CLINICAL DATA:  Chest pain  EXAM: CHEST  2 VIEW  COMPARISON:  None.  FINDINGS: The cardiac and mediastinal silhouettes are stable in size and contour,  and remain within normal limits.  The lungs are normally inflated. No airspace consolidation, pleural effusion, or pulmonary edema is identified. There is no pneumothorax.  No acute osseous abnormality identified. Minimal degenerative spurring noted within the mid thoracic spine. Surgical clips overlie the left axilla.  IMPRESSION: No active cardiopulmonary disease.   Electronically Signed   By: Jeannine Boga M.D.   On: 06/18/2014 14:00   US Abdomen Complete  06/18/2014   CLINICAL DATA:  Upper abdominal pain, elevated LFTs  EXAM: ULTRASOUND ABDOMEN COMPLETE  COMPARISON:  None.  FINDINGS: Gallbladder:  Surgically absent   Common bile duct:  Diameter: CBD dilatation with CBD measuring 18.3 mm.  Liver:  There is heterogeneous liver with intrahepatic biliary ductal dilatation. Hypoechoic cystic lesion in right hepatic lobe centrally measures 1.7 x 1.5 cm.  IVC:  No abnormality visualized.  Pancreas:  Limited assessment due to abundant bowel gas  Spleen:  Size and appearance within normal limits.  Right Kidney:  Length: 11 cm. Echogenicity within normal limits. No mass or hydronephrosis visualized.  Left Kidney:  Length: 12.9 cm. Echogenicity within normal limits. No mass or hydronephrosis visualized.  Bilateral renal cortical thinning probable due to atrophy.  Abdominal aorta:  No aneurysm visualized.  Measures up to 2.7 cm in diameter.  Other findings:  None.  IMPRESSION: 1. Surgically absent gallbladder.  CBD dilatation up to 1.8 cm. 2. There is intrahepatic biliary ductal dilatation. Cystic lesion in right hepatic lobe centrally measures 1.7 x 1.5 cm. Further correlation with enhanced CT or MRI could be performed. 3. Bilateral renal cortical thinning probable due to atrophy. No hydronephrosis. No focal renal mass. 4. No aortic aneurysm.   Electronically Signed   By: Lahoma Crocker M.D.   On: 06/18/2014 16:18   Ct Abdomen Pelvis W Contrast  06/19/2014   CLINICAL DATA:  Abdominal pain. History of pancreatitis, diabetes and hypertension.  EXAM: CT ABDOMEN AND PELVIS WITH CONTRAST  TECHNIQUE: Multidetector CT imaging of the abdomen and pelvis was performed using the standard protocol following bolus administration of intravenous contrast.  CONTRAST:  80 ml Omnipaque 300.  COMPARISON:  Abdominal ultrasound same date.  FINDINGS: Lung bases: Clear. No significant pleural or pericardial effusion.  Liver/Biliary/Pancreas: The gallbladder is surgically absent. There is moderate intra and extrahepatic biliary dilatation. The common hepatic duct measures up to 1.7 cm in diameter. The common bile duct tapers distally within the pancreatic head.  No calcified intraductal stone or pancreatic mass is identified. There is no evidence of pancreatitis, pancreatic ductal dilatation or surrounding fluid collection. The pancreatic head appears normal. The pancreatic body and tail are markedly diminutive. Multiple ill-defined low-density lesions are present throughout the right hepatic lobe. There is no well-defined mass.  Spleen/Adrenal glands: The spleen is enlarged with mildly heterogeneous density. No focal lesions are identified. There is no adrenal mass.  Kidneys/Ureters/Bladder: There is a small nonobstructing calculus in the mid right kidney. No other urinary tract calculi are demonstrated. There is no evidence of renal mass, hydronephrosis or bladder lesion.  Bowel/Peritoneum: There is diffuse gastric wall thickening, especially in the antrum. No small bowel abnormalities are identified. The appendix is not visualized. There is questionable circumferential wall thickening of the distal transverse colon (axial image 42). There is no evidence of associated bowel obstruction. However, there is an adjacent bilobed mass measuring up to 3.5 x 3.2 cm transverse on image 38. This has fat density along its periphery and could reflect an atypical area of fat necrosis. Within the  base the mesentery, there is a low-density structure measuring 2.3 x 2.7 cm. This is adjacent to a surgical clip and could reflect a postoperative finding. There is no ascites.  Retroperitoneum/Pelvis: There are no enlarged abdominal or pelvic lymph nodes. There is mild aortoiliac atherosclerosis. 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 In addition, the splenic vein appears chronically occluded. There are numerous left upper quadrant abdominal varices. In addition, there are multiple large varices of the right ovarian vein. No adnexal mass is seen. There is a small calcified uterine fibroid.  Abdominal wall: Postsurgical changes are present within the anterior  abdominal wall. There is no evidence of abdominal wall mass or hernia.  Musculoskeletal: No acute or significant osseus findings.  IMPRESSION: 1. No evidence of acute pancreatitis. The pancreatic body and tail are markedly diminutive, possibly secondary to prior pancreatitis or congenital variant. 2. 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 3. Omental/mesenteric masses concerning for malignancy. In addition, there are multiple ill-defined low-density lesions distributed throughout the right hepatic lobe which are associated with intra and extrahepatic biliary dilatation. Infiltrating hepatic malignancy is a consideration. 4. Abdominal MRI without and with contrast may be helpful to further evaluate the liver. Alternatively, PET-CT could be performed to assess for metabolic activity within the liver and mesenteric lesions.   Electronically Signed   By: Camie Patience M.D.   On: 06/19/2014 07:44       Medications:    Infusions: . sodium chloride 75 mL/hr at 06/19/14 0552  . sodium chloride      Scheduled Medications: . insulin aspart  0-15 Units Subcutaneous 6 times per day  . lisinopril  20 mg Oral BID  . metoprolol tartrate  25 mg Oral Daily  . ranitidine  150 mg Oral BID  . sodium chloride  3 mL Intravenous Q12H  . sotalol  80 mg Oral BID    PRN Medications: HYDROmorphone (DILAUDID) injection, ondansetron (ZOFRAN) IV, ondansetron   Assessment/ Plan:    Principal Problem:   Abdominal pain, epigastric Active Problems:   Acute pancreatitis   A-fib   Hx of ischemic bowel disease   Diabetes   Liver masses   Thrombosis of mesenteric artery   Splenic infarction   Portal vein thrombosis   Portal hypertension   HTN (hypertension), benign   Atrial fibrillation   Other pancytopenia  #Abdominal pain with new masses on CT Patient presented with epigastric abdominal pain, nausea, and vomiting with a history of  acute pancreatitis in 7096 complicated by pseudocyst that had to be drained and previous cholecystectomy.  She also underwent exploratory laproscopy for ischemic colitis on 04/12/14.  Lipase >3000.  AST, ALT, and T bili elevated with mild elevation of Alk Phos. Lipid panel normal with no hypertriglyceridemia. CXR normal.  Abdominal US demonstrated CBD dilation up to 1.8 cm with intrahepatic biliary ductal dilation and a cystic lesion in R hepatic lobe measuring 1.7 x 1.5 cm.  Originally abdominal pain thought to be consistent with gallstone pancreatitis.  GI (Dr. Benson Norway) was consulted and recommended ERCP tomorrow for sphincterotomy.  Today, CT A/P showed no evidence of acute pancreatitis, but showed omental and mesenteric masses along with multiple ill-defined lesions in R hepatic lobe concerning for malignancy.  GI informed of this and still recommends ERCP tomorrow.  Hem/onc (Dr. Beryle Beams) consulted for possible HIT (see below) and recommended liver MRI based on radiology recs for further work-up. CT scan from [facility] during  hospitalization for ischemic colitis on 04/12/14 obtained, which showed a SMA thrombus, splenic infarct, and CBD dilation without mention of abdominal masses or liver changes.  Etiology of the new masses and liver findings unclear currently, but malignancy a concern given hypercoagulability.  However, it would be unlikely for malignancy to develop new masses this rapidly.  Perhaps, these changes are post-surgical fat necrosis, which could explain elevation in lipase.  Pain, nausea, vomiting improved today. -MRI with and without contrast scheduled for tomorrow morning. -ERCP by Dr. Benson Norway tomorrow. -Tumor markers: AFP, CEA, CA19-9. -Hepatitis panel. -HIV antibody. -Getting records from previous hospitalization. -Liquid diet, NPO after midnight for procedures. -Dilaudid 0.5 mg q3h PRN.  #Acute onset pancytopenia WBC (6.9>2.4), Hgb (12.2>8.8), Platelets (123>54) all decreased this  morning after receiving only heparin and Dilaudid last night.  Concern for HIT, but last heparin exposure >30 days ago.  4 Ts score = 4 intermediate probability.  Hem/onc consulted and raised concern for early DIC, but no schistocytes on blood smear.  D-dimer elevated at 4.13, but fibrinogen normal. In view of recent SMA thrombosis, hem/onc recommended argatroban until heparin antibody panel results back.  Argatroban has a half-life of minutes and can be stopped prior to biopsy procedure or ERCP. -No heparin. -HIT panel. -Argatroban per pharmacy consult. -LDH. -Follow CBC.  #Hypercoagulability Recent SMA thrombosis attributed to atrial fibrillation, however this is not a typical distribution of ischemia from a cardiac source.  Possible underlying hypercoagulability due to malignancy, congenital coagulopathy, or antiphospholipid antibody syndrome. -Hypercoagulability panel.  #Atrial fibrillation Unlikely to be cause of SMA thrombosis, on Eliquis and sotalol at home.  In sinus rthythm today.  Holding Eliquis for procedure and on argatroban currently due to concern for HIT. -Hold home Eliquis 5 mg BID. -Restarted sotalol 80 mg BID to maintain sinus rhythm. -On telemetry.  #DM2 On metformin 500 mg BID and Lantus 30 units QHS with Humalog correction at home. Holding currently, CBGs 120-170s -Holding Lantus and metformin. -SSI moderate.  #HTN Blood pressure elevated to 174/53 today after not having home meds. -Restart home lisinopril 20 mg BID, metoprolol 25 mg daily. -Holding furosemide 40 mg daily due to possible dehydration, imaging tomorrow.  #HLD Lipid panel cholesterol 73, triglycerides 73, HDL 47, LDL 11. -Restart home Crestor 20 mg QHS.   DVT PPX - SCD's while in bed  CODE STATUS - Full code  CONSULTS PLACED - GI, Hem/onc  DISPO - Disposition is deferred at this time, awaiting work-up of CT findings.   Anticipated discharge in approximately 3 day(s).   The patient does  have a current PCP Solmon Ice CNOBS-[phone]) and does not need an [facility] hospital follow-up appointment after discharge.    Is the [facility] hospital follow-up appointment a one-time only appointment? Not applicable.  Does the patient have transportation limitations that hinder transportation to clinic appointments? no   SERVICE NEEDED AT [place]         Y = Yes, Blank = No PT:   OT:   RN:   Equipment:   Other:      Length of Stay: 1 day(s)   Signed: Arman Filter, MD  PGY-1, Internal Medicine Resident Pager: [phone] (7AM-5PM) 06/19/2014, 3:03 PM

## 2014-06-20 ENCOUNTER — Encounter (HOSPITAL_COMMUNITY)
Admission: EM | Disposition: A | Discharge status: Home / Self Care | Payer: Self-pay | Source: Home / Self Care | Attending: Internal Medicine

## 2014-06-20 ENCOUNTER — Inpatient Hospital Stay (HOSPITAL_COMMUNITY): Payer: Medicare HMO

## 2014-06-20 ENCOUNTER — Encounter (HOSPITAL_COMMUNITY): Payer: Self-pay

## 2014-06-20 DIAGNOSIS — D6859 Other primary thrombophilia: Secondary | ICD-10-CM

## 2014-06-20 HISTORY — PX: ERCP: SHX5425

## 2014-06-20 LAB — DIFFERENTIAL
BASOS ABS: 0 10*3/uL (ref 0.0–0.1)
Basophils Relative: 0 % (ref 0–1)
Eosinophils Absolute: 0.1 10*3/uL (ref 0.0–0.7)
Eosinophils Relative: 4 % (ref 0–5)
LYMPHS ABS: 0.6 10*3/uL — AB (ref 0.7–4.0)
LYMPHS PCT: 22 % (ref 12–46)
MONO ABS: 0.3 10*3/uL (ref 0.1–1.0)
Monocytes Relative: 11 % (ref 3–12)
NEUTROS ABS: 1.7 10*3/uL (ref 1.7–7.7)
Neutrophils Relative %: 62 % (ref 43–77)

## 2014-06-20 LAB — GLUCOSE, CAPILLARY
GLUCOSE-CAPILLARY: 158 mg/dL — AB (ref 70–99)
GLUCOSE-CAPILLARY: 216 mg/dL — AB (ref 70–99)
GLUCOSE-CAPILLARY: 260 mg/dL — AB (ref 70–99)
Glucose-Capillary: 120 mg/dL — ABNORMAL HIGH (ref 70–99)
Glucose-Capillary: 201 mg/dL — ABNORMAL HIGH (ref 70–99)
Glucose-Capillary: 234 mg/dL — ABNORMAL HIGH (ref 70–99)
Glucose-Capillary: 239 mg/dL — ABNORMAL HIGH (ref 70–99)

## 2014-06-20 LAB — BETA-2-GLYCOPROTEIN I ABS, IGG/M/A
BETA-2-GLYCOPROTEIN I IGA: 37 A Units — AB (ref <20)
BETA-2-GLYCOPROTEIN I IGM: 26 M Units — AB (ref <20)
Beta-2 Glyco I IgG: 0 G Units (ref <20)

## 2014-06-20 LAB — SAVE SMEAR

## 2014-06-20 LAB — CARDIOLIPIN ANTIBODIES, IGG, IGM, IGA
ANTICARDIOLIPIN IGA: 10 U/mL — AB (ref <22)
Anticardiolipin IgG: 4 GPL U/mL — ABNORMAL LOW (ref <23)
Anticardiolipin IgM: 6 MPL U/mL — ABNORMAL LOW (ref <11)

## 2014-06-20 LAB — CBC
HEMATOCRIT: 29.3 % — AB (ref 36.0–46.0)
HEMOGLOBIN: 10 g/dL — AB (ref 12.0–15.0)
MCH: 29.3 pg (ref 26.0–34.0)
MCHC: 34.1 g/dL (ref 30.0–36.0)
MCV: 85.9 fL (ref 78.0–100.0)
Platelets: 62 10*3/uL — ABNORMAL LOW (ref 150–400)
RBC: 3.41 MIL/uL — AB (ref 3.87–5.11)
RDW: 14.2 % (ref 11.5–15.5)
WBC: 2.7 10*3/uL — AB (ref 4.0–10.5)

## 2014-06-20 LAB — AFP TUMOR MARKER: AFP TUMOR MARKER: 4.5 ng/mL (ref 0.0–8.0)

## 2014-06-20 LAB — CEA: CEA: <0.5 ng/mL (ref 0.0–5.0)

## 2014-06-20 LAB — LUPUS ANTICOAGULANT PANEL
DRVVT: 40.8 secs (ref <42.9)
Lupus Anticoagulant: NOT DETECTED
PTT LA: 42.9 s (ref 28.0–43.0)

## 2014-06-20 LAB — COMPREHENSIVE METABOLIC PANEL
ALT: 576 U/L — ABNORMAL HIGH (ref 0–35)
AST: 494 U/L — AB (ref 0–37)
Albumin: 3.1 g/dL — ABNORMAL LOW (ref 3.5–5.2)
Alkaline Phosphatase: 228 U/L — ABNORMAL HIGH (ref 39–117)
Anion gap: 9 (ref 5–15)
BILIRUBIN TOTAL: 4.1 mg/dL — AB (ref 0.3–1.2)
BUN: 13 mg/dL (ref 6–23)
CHLORIDE: 104 meq/L (ref 96–112)
CO2: 27 meq/L (ref 19–32)
CREATININE: 0.68 mg/dL (ref 0.50–1.10)
Calcium: 8.5 mg/dL (ref 8.4–10.5)
GFR calc Af Amer: >90 mL/min (ref >90)
GFR calc non Af Amer: 86 mL/min — ABNORMAL LOW (ref >90)
GLUCOSE: 184 mg/dL — AB (ref 70–99)
Potassium: 3.5 mEq/L — ABNORMAL LOW (ref 3.7–5.3)
Sodium: 140 mEq/L (ref 137–147)
Total Protein: 5.7 g/dL — ABNORMAL LOW (ref 6.0–8.3)

## 2014-06-20 LAB — APTT
aPTT: 50 seconds — ABNORMAL HIGH (ref 24–37)
aPTT: 63 seconds — ABNORMAL HIGH (ref 24–37)

## 2014-06-20 LAB — LIPASE, BLOOD: Lipase: 85 U/L — ABNORMAL HIGH (ref 11–59)

## 2014-06-20 LAB — PROTEIN C ACTIVITY: Protein C Activity: 92 % (ref 75–133)

## 2014-06-20 LAB — FIBRINOGEN: Fibrinogen: 301 mg/dL (ref 204–475)

## 2014-06-20 LAB — CANCER ANTIGEN 19-9: CA 19-9: 16.8 U/mL — ABNORMAL LOW (ref <35.0)

## 2014-06-20 LAB — D-DIMER, QUANTITATIVE (NOT AT ARMC): D DIMER QUANT: 2.49 ug{FEU}/mL — AB (ref 0.00–0.48)

## 2014-06-20 LAB — PROTEIN S ACTIVITY: PROTEIN S ACTIVITY: 66 % — AB (ref 69–129)

## 2014-06-20 LAB — HOMOCYSTEINE: HOMOCYSTEINE-NORM: 8.4 umol/L (ref 4.0–15.4)

## 2014-06-20 LAB — LACTATE DEHYDROGENASE: LDH: 482 U/L — ABNORMAL HIGH (ref 94–250)

## 2014-06-20 SURGERY — ERCP, WITH INTERVENTION IF INDICATED
Anesthesia: Moderate Sedation

## 2014-06-20 SURGERY — ERCP, WITH INTERVENTION IF INDICATED
Anesthesia: General

## 2014-06-20 MED ORDER — FENTANYL CITRATE 0.05 MG/ML IJ SOLN
INTRAMUSCULAR | Status: DC | PRN
  Administered 2014-06-20 (×2): 25 ug via INTRAVENOUS

## 2014-06-20 MED ORDER — METOPROLOL TARTRATE 12.5 MG HALF TABLET
12.5000 mg | ORAL_TABLET | Freq: Two times a day (BID) | ORAL | Status: DC
  Filled 2014-06-20: qty 1

## 2014-06-20 MED ORDER — POTASSIUM CHLORIDE 10 MEQ/100ML IV SOLN
10.0000 meq | INTRAVENOUS | Status: AC
  Administered 2014-06-20: 10 meq via INTRAVENOUS
  Filled 2014-06-20 (×2): qty 100

## 2014-06-20 MED ORDER — LIDOCAINE VISCOUS 2 % MT SOLN
OROMUCOSAL | Status: DC | PRN
  Administered 2014-06-20: 10 mL via OROMUCOSAL

## 2014-06-20 MED ORDER — MIDAZOLAM HCL 10 MG/2ML IJ SOLN
INTRAMUSCULAR | Status: DC | PRN
  Administered 2014-06-20: 1 mg via INTRAVENOUS
  Administered 2014-06-20 (×2): 2 mg via INTRAVENOUS

## 2014-06-20 MED ORDER — POTASSIUM CHLORIDE CRYS ER 20 MEQ PO TBCR
40.0000 meq | EXTENDED_RELEASE_TABLET | Freq: Once | ORAL | Status: AC
  Administered 2014-06-20: 40 meq via ORAL
  Filled 2014-06-20: qty 2

## 2014-06-20 MED ORDER — METOPROLOL TARTRATE 12.5 MG HALF TABLET
12.5000 mg | ORAL_TABLET | Freq: Two times a day (BID) | ORAL | Status: DC
  Administered 2014-06-21 – 2014-06-22 (×3): 12.5 mg via ORAL
  Filled 2014-06-20 (×5): qty 1

## 2014-06-20 MED ORDER — GADOBENATE DIMEGLUMINE 529 MG/ML IV SOLN
15.0000 mL | Freq: Once | INTRAVENOUS | Status: AC | PRN
  Administered 2014-06-20: 14 mL via INTRAVENOUS

## 2014-06-20 MED ORDER — INSULIN GLARGINE 100 UNIT/ML ~~LOC~~ SOLN
10.0000 [IU] | Freq: Every day | SUBCUTANEOUS | Status: DC
  Administered 2014-06-20: 10 [IU] via SUBCUTANEOUS
  Filled 2014-06-20 (×2): qty 0.1

## 2014-06-20 MED ORDER — MIDAZOLAM HCL 5 MG/ML IJ SOLN
INTRAMUSCULAR | Status: AC
  Filled 2014-06-20: qty 2

## 2014-06-20 MED ORDER — LIDOCAINE VISCOUS 2 % MT SOLN
OROMUCOSAL | Status: AC
  Filled 2014-06-20: qty 15

## 2014-06-20 MED ORDER — FENTANYL CITRATE 0.05 MG/ML IJ SOLN
INTRAMUSCULAR | Status: AC
  Filled 2014-06-20: qty 4

## 2014-06-20 MED ORDER — DIPHENHYDRAMINE HCL 50 MG/ML IJ SOLN
INTRAMUSCULAR | Status: AC
  Filled 2014-06-20: qty 1

## 2014-06-20 MED ORDER — GLUCAGON HCL RDNA (DIAGNOSTIC) 1 MG IJ SOLR
INTRAMUSCULAR | Status: AC
  Filled 2014-06-20: qty 1

## 2014-06-20 MED ORDER — AMLODIPINE BESYLATE 2.5 MG PO TABS
2.5000 mg | ORAL_TABLET | Freq: Every day | ORAL | Status: DC
  Filled 2014-06-20: qty 1

## 2014-06-20 NOTE — Progress Notes (Signed)
Patient alert and oriented patient received MRI today of abdomen, patient also received ERCP, unable to complete procedure due to MD unable to access opening to duct. 1 episode of atrial flutter, maintained heartrate 140s for 6 hours, vomited x1, zofran given, effective, diet advanced to regular, potassium po given, refused norvasc due to stating swelling of feet, MD aware, rhythm now SR/ SB continuing to monitor

## 2014-06-20 NOTE — ED Provider Notes (Signed)
Medical screening examination/treatment/procedure(s) were conducted as a shared visit with non-physician practitioner(s) and myself.  I personally evaluated the patient during the encounter. Please see my previous note.    EKG Interpretation   Date/Time:  Tuesday June 18 2014 13:00:17 EDT Ventricular Rate:  50 PR Interval:  143 QRS Duration: 95 QT Interval:  458 QTC Calculation: 418 R Axis:   -14 Text Interpretation:  Sinus rhythm Abnormal R-wave progression, early  transition LVH with secondary repolarization abnormality Baseline wander  No old tracing to compare Confirmed by [facility]  MD, Nunzio Cory [phone]) on  06/18/2014 1:33:50 PM        Francine Graven, DO 06/20/14 1549

## 2014-06-20 NOTE — Op Note (Signed)
[facility] [place], [address]   ERCP PROCEDURE REPORT  PATIENT: Wendy Meza, Wendy Meza  MR# :[id number] BIRTHDATE: [birth year]  GENDER: Female ENDOSCOPIST: Carol Ada, MD REFERRED BY: PROCEDURE DATE:  06/20/2014 PROCEDURE:   ERCP, diagnostic - Incomplete ASA CLASS:   Class III INDICATIONS:Abnormal biliary tract. MEDICATIONS: Versed 5 mg IV and Fentanyl 75 mcg IV TOPICAL ANESTHETIC: none  DESCRIPTION OF PROCEDURE:   After the risks benefits and alternatives of the procedure were thoroughly explained, informed consent was obtained.  The [id number] ([id number])  endoscope was introduced through the mouth  and advanced to the second portion of the duodenum.  A diverticulum was noted and this first area was felt to be the site of the major papilla, but with manipulation, i.e., everting the diverticulum, there was no evidence of a papilla.  Just superior to this site, some bile was noted.  Between these folds was another diverticulum and I believe the major papilla was deeply inset in the diverticulum.  Manipulation to expose the major papilla failed.  At that point the procedure was terminated.  Incidentally, the gastric lumen was found to be normal and there was no evidence of any mucosal abnormalities to correlate with the CT scan findings of a thickened antrum.   The scope was then completely withdrawn from the patient and the procedure terminated.     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  Unable to expose and visualize the papilla.  RECOMMENDATIONS: 1) Continue work up for a malignant process. 2) If there is true biliary obstruction a percutaneous access will be required.    _______________________________ eSigned:  Carol Ada, MD 06/20/2014 3:56 PM   CC:

## 2014-06-20 NOTE — Progress Notes (Signed)
ANTICOAGULATION CONSULT NOTE - Follow Up Consult  Pharmacy Consult for Angiomax >> Bivalirudin Indication: Questionable HIT  Allergies  Allergen Reactions  . Polysporin [Bacitracin-Polymyxin B] Other (See Comments)    unknown    Patient Measurements: Height: 5\' 1"  (154.9 cm) Weight: 140 lb 14 oz (63.9 kg) IBW/kg (Calculated) : 47.8  Vital Signs: Temp: 98.9 F (37.2 C) (08/12 2009) Temp src: Oral (08/12 2009) BP: 167/43 mmHg (08/12 2009) Pulse Rate: 53 (08/12 2009)  Labs:  Recent Labs  06/18/14 1333 06/18/14 1358 06/18/14 2147  06/19/14 0706 06/19/14 1403 06/19/14 1500 06/19/14 1800 06/20/14 0017  HGB 12.2 12.6  --   --  8.8*  --  9.9*  --  10.0*  HCT 36.4 37.0  --   --  25.7*  --  29.2*  --  29.3*  PLT 123*  --   --   --  54*  --  58*  --  62*  APTT  --   --   --   < > >200* 34  --  66* 63*  LABPROT  --   --  22.8*  --   --   --   --   --   --   INR  --   --  2.01*  --   --   --   --   --   --   HEPARINUNFRC  --   --  >2.20*  --  >2.20*  --   --   --   --   CREATININE 0.85 1.00  --   --   --   --   --   --  0.68  < > = values in this interval not displayed.  Estimated Creatinine Clearance: 55.2 ml/min (by C-G formula based on Cr of 0.68).   Medications: angiomax at 0.1 mg/kg/hr (6.39 mg/hr)  Assessment: 79 YOF who presented to the [facility] on 8/11 with abdominal pain and acute pancreatitis. The patient was previously on Apixaban for anticoagulation in the setting of Afib - this was held on admission and transitioned to heparin for anticoagulation. Overnight, the patient's platelets had a significant drop by 50% and hematology was consulted. It was recommended to switch the patient to argatroban while ruling out HIT. An aPTT this evening came back therapeutic at 66 seconds (goal of 50-90 seconds).   Upon review of this patient this evening, it was determined to switch the patient from argatroban to bivalirudin for anticoagulation in the setting of possible HIT.  Argatroban is hepatically cleared and given the patient's elevations in LFTs - kinetics will be difficult. The half life of argatroban can range from 60-180 minutes with impaired hepatic function.  Bivalirudin will have more stable kinetics while awaiting normalization of the patient's hepatic function. Will plan to make this transition this evening. Will plan to hold argatroban for 1.5 hours - then initiate bivalirudin.       After initiation of angiomax at 0.1mg ./kg/hr the 1st aPTT is within range at 63 seconds.   Goal of Therapy:  aPTT 50-85 seconds Monitor platelets by anticoagulation protocol: Yes   Plan:  Continue angiomax at 6.39mg /hr and check aPTT with am labs.   Curlene Dolphin

## 2014-06-20 NOTE — Progress Notes (Signed)
Inpatient Diabetes Program Recommendations  AACE/ADA: New Consensus Statement on Inpatient Glycemic Control (2013)  Target Ranges:  Prepandial:   less than 140 mg/dL      Peak postprandial:   less than 180 mg/dL (1-2 hours)      Critically ill patients:  140 - 180 mg/dL   Results for [name], [name] (MRN [id number]) as of 06/20/2014 13:15  Ref. Range 06/19/2014 06:30 06/19/2014 12:40 06/19/2014 16:16 06/19/2014 20:06 06/20/2014 00:05 06/20/2014 04:16 06/20/2014 08:02 06/20/2014 11:29  Glucose-Capillary Latest Range: 70-99 mg/dL 120 (H) 174 (H) 280 (H) 284 (H) 201 (H) 120 (H) 158 (H) 216 (H)   Diabetes history: DM Outpatient Diabetes medications: Lantus 30 units QHS, Humalog 10 units daily (if CBG >160 mg/dl), Metformin 500 mg BID Current orders for Inpatient glycemic control: Novolog 0-15 units Q4H  Inpatient Diabetes Program Recommendations Insulin - Basal: Please consider ordering low dose basal insulin; recommend starting with Lantus 10 units QHS (which is 1/3 of home dose).   Note: Patient has received a total of Novolog 24 units over the past 24 hours for glucose correction. Please consider ordering low dose basal insulin; recommend starting with Lantus 10 units QHS.   Thanks, Barnie Alderman, RN, MSN, CCRN Diabetes Coordinator Inpatient Diabetes Program [phone] (Team Pager) [phone] (AP office) [phone] [facility] office)

## 2014-06-20 NOTE — Progress Notes (Signed)
Md notified, patient unable to handle iv potassium, attempted lowering rate patient still complaining of pain, iv site, clean dry intact, no sign infiltration, swelling or redness

## 2014-06-20 NOTE — Progress Notes (Signed)
ANTICOAGULATION CONSULT NOTE - Follow Up Consult  Pharmacy Consult for  Bivalirudin Indication: Questionable HIT; hx afib (off apixaban)  Allergies  Allergen Reactions  . Polysporin [Bacitracin-Polymyxin B] Other (See Comments)    unknown    Patient Measurements: Height: 5\' 1"  (154.9 cm) Weight: 140 lb 14 oz (63.9 kg) IBW/kg (Calculated) : 47.8  Vital Signs: Temp: 98.5 F (36.9 C) (08/13 0420) Temp src: Oral (08/13 0420) BP: 174/51 mmHg (08/13 0420) Pulse Rate: 53 (08/13 0420)  Labs:  Recent Labs  06/18/14 1333 06/18/14 1358 06/18/14 2147 06/19/14 0706  06/19/14 1500 06/19/14 1800 06/20/14 0017 06/20/14 0831  HGB 12.2 12.6  --  8.8*  --  9.9*  --  10.0*  --   HCT 36.4 37.0  --  25.7*  --  29.2*  --  29.3*  --   PLT 123*  --   --  54*  --  58*  --  62*  --   APTT  --   --   --  >200*  < >  --  66* 63* 50*  LABPROT  --   --  22.8*  --   --   --   --   --   --   INR  --   --  2.01*  --   --   --   --   --   --   HEPARINUNFRC  --   --  >2.20* >2.20*  --   --   --   --   --   CREATININE 0.85 1.00  --   --   --   --   --  0.68  --   < > = values in this interval not displayed.  Estimated Creatinine Clearance: 55.2 ml/min (by C-G formula based on Cr of 0.68).   Medications: Bivalrudin at 0.1 mg/kg/hr (6.39 mg/hr)  Assessment: 73 yo F who presented to the [facility] on 8/11 with abdominal pain and acute pancreatitis. The patient was previously on apixaban for anticoagulation in the setting of Afib - this was held on admission and transitioned to heparin for anticoagulation. While on heparin, the patient's platelets had a significant drop by 50% and hematology was consulted. It was recommended to switch the patient to argatroban while ruling out HIT. The patient was also noted to have elevated LFTs and was then transitioned to Bivalrudin.    Bivalrudin continues at 0.1mg /kg/hr the with therapeutic aPTT.  Goal of Therapy:  aPTT 50-85 seconds Monitor platelets by  anticoagulation protocol: Yes   Plan:  Continue bivalrudin at 6.39mg /hr  Daily aPTT Follow up HIT panel results   Horton Chin, Pharm.D., BCPS Clinical Pharmacist Pager [phone] 06/20/2014 11:12 AM

## 2014-06-20 NOTE — Progress Notes (Addendum)
Subjective:    Currently, the patient continues to feel well with no abdominal pain, nausea or vomiting.  She reports a mild headache that improved with her PRN Dilaudid.  She denies bleeding, bruising, dizziness, fevers, chills, chest pain, or shortness of breath.  Interval Events: -Elevated blood pressure 174/51 this morning after being back on home meds. -Blood counts slightly improved this morning, with improvement in LFTs. -Tumor markers, HIV, and acute hepatitis negative. -LDH and lactate elevated at 482 and 2.3. -Pharmacy switched from argatroban to bivalirudin due to decreased hepatic clearance. PTT 63 this morning. -NPO this morning for procedure.   Objective:    Vital Signs:   Temp:  [98.3 F (36.8 C)-98.9 F (37.2 C)] 98.5 F (36.9 C) (08/13 0420) Pulse Rate:  [53-59] 53 (08/13 0420) Resp:  [17-18] 18 (08/13 0420) BP: (167-174)/(43-53) 174/51 mmHg (08/13 0420) SpO2:  [93 %-100 %] 93 % (08/13 0420) Last BM Date: 06/18/14  24-hour weight change: Weight change:   Intake/Output:   Intake/Output Summary (Last 24 hours) at 06/20/14 0804 Last data filed at 06/20/14 7591  Gross per 24 hour  Intake 851.67 ml  Output   1700 ml  Net -848.33 ml      Physical Exam: General: Vital signs reviewed and noted. Well-developed, well-nourished, in no acute distress; alert, appropriate and cooperative throughout examination.  Lungs:  Normal respiratory effort. Clear to auscultation BL without crackles or wheezes.  Heart: RRR. S1 and S2 normal without gallop, murmur, or rubs.  Abdomen:  Organomegaly not appreciated on exam. BS normoactive. Soft, non-distended, non-tender.  Extremities: No pretibial edema. Skin scars on lower extremities bilaterally with skin graft on left shin.     Labs:  Basic Metabolic Panel:  Recent Labs Lab 06/18/14 1333 06/18/14 1358 06/20/14 0017  NA 140 138 140  K 4.7 4.3 3.5*  CL 101 103 104  CO2 22  --  27  GLUCOSE 392* 405* 184*  BUN 18  21 13   CREATININE 0.85 1.00 0.68  CALCIUM 9.2  --  8.5    Liver Function Tests:  Recent Labs Lab 06/18/14 1333 06/19/14 0805 06/20/14 0017  AST 357* 1217* 494*  ALT 135* 787* 576*  ALKPHOS 127* 186* 228*  BILITOT 2.2* 4.0* 4.1*  PROT 6.5 6.1 5.7*  ALBUMIN 3.7 3.3* 3.1*    Recent Labs Lab 06/18/14 1333  LIPASE >3000*   CBC:  Recent Labs Lab 06/18/14 1333 06/18/14 1358 06/19/14 0706 06/19/14 1500 06/20/14 0017  WBC 6.9  --  2.4* 2.7* 2.7*  NEUTROABS 5.5  --   --  1.7 1.7  HGB 12.2 12.6 8.8* 9.9* 10.0*  HCT 36.4 37.0 25.7* 29.2* 29.3*  MCV 86.9  --  84.8 86.6 85.9  PLT 123*  --  54* 58* 62*   CBG:  Recent Labs Lab 06/19/14 1616 06/19/14 2006 06/20/14 0005 06/20/14 0416 06/20/14 0802  GLUCAP 280* 284* 201* 120* 158*   Coagulation Studies:  Recent Labs  06/18/14 2147  LABPROT 22.8*  INR 2.01*    Other results: Lactate 2.3. Homocystein 8.4. Antithrombin III function 69. Hep A IgM, Hep B Surface Ag, Hep B C IgM, HCV Ab, HIV, negative. LDH 482. AFP 4.5, CA19-9 16.8, CEA <0.5  Lab Results  Component Value Date   AFPTM 4.5 06/19/2014   Imaging: Dg Chest 2 View  06/18/2014   CLINICAL DATA:  Chest pain  EXAM: CHEST  2 VIEW  COMPARISON:  None.  FINDINGS: The cardiac and mediastinal silhouettes are stable  in size and contour, and remain within normal limits.  The lungs are normally inflated. No airspace consolidation, pleural effusion, or pulmonary edema is identified. There is no pneumothorax.  No acute osseous abnormality identified. Minimal degenerative spurring noted within the mid thoracic spine. Surgical clips overlie the left axilla.  IMPRESSION: No active cardiopulmonary disease.   Electronically Signed   By: Jeannine Boga M.D.   On: 06/18/2014 14:00   US Abdomen Complete  06/18/2014   CLINICAL DATA:  Upper abdominal pain, elevated LFTs  EXAM: ULTRASOUND ABDOMEN COMPLETE  COMPARISON:  None.  FINDINGS: Gallbladder:  Surgically absent   Common bile duct:  Diameter: CBD dilatation with CBD measuring 18.3 mm.  Liver:  There is heterogeneous liver with intrahepatic biliary ductal dilatation. Hypoechoic cystic lesion in right hepatic lobe centrally measures 1.7 x 1.5 cm.  IVC:  No abnormality visualized.  Pancreas:  Limited assessment due to abundant bowel gas  Spleen:  Size and appearance within normal limits.  Right Kidney:  Length: 11 cm. Echogenicity within normal limits. No mass or hydronephrosis visualized.  Left Kidney:  Length: 12.9 cm. Echogenicity within normal limits. No mass or hydronephrosis visualized.  Bilateral renal cortical thinning probable due to atrophy.  Abdominal aorta:  No aneurysm visualized.  Measures up to 2.7 cm in diameter.  Other findings:  None.  IMPRESSION: 1. Surgically absent gallbladder.  CBD dilatation up to 1.8 cm. 2. There is intrahepatic biliary ductal dilatation. Cystic lesion in right hepatic lobe centrally measures 1.7 x 1.5 cm. Further correlation with enhanced CT or MRI could be performed. 3. Bilateral renal cortical thinning probable due to atrophy. No hydronephrosis. No focal renal mass. 4. No aortic aneurysm.   Electronically Signed   By: Lahoma Crocker M.D.   On: 06/18/2014 16:18   Ct Abdomen Pelvis W Contrast  06/19/2014   CLINICAL DATA:  Abdominal pain. History of pancreatitis, diabetes and hypertension.  EXAM: CT ABDOMEN AND PELVIS WITH CONTRAST  TECHNIQUE: Multidetector CT imaging of the abdomen and pelvis was performed using the standard protocol following bolus administration of intravenous contrast.  CONTRAST:  80 ml Omnipaque 300.  COMPARISON:  Abdominal ultrasound same date.  FINDINGS: Lung bases: Clear. No significant pleural or pericardial effusion.  Liver/Biliary/Pancreas: The gallbladder is surgically absent. There is moderate intra and extrahepatic biliary dilatation. The common hepatic duct measures up to 1.7 cm in diameter. The common bile duct tapers distally within the pancreatic head. No  calcified intraductal stone or pancreatic mass is identified. There is no evidence of pancreatitis, pancreatic ductal dilatation or surrounding fluid collection. The pancreatic head appears normal. The pancreatic body and tail are markedly diminutive. Multiple ill-defined low-density lesions are present throughout the right hepatic lobe. There is no well-defined mass.  Spleen/Adrenal glands: The spleen is enlarged with mildly heterogeneous density. No focal lesions are identified. There is no adrenal mass.  Kidneys/Ureters/Bladder: There is a small nonobstructing calculus in the mid right kidney. No other urinary tract calculi are demonstrated. There is no evidence of renal mass, hydronephrosis or bladder lesion.  Bowel/Peritoneum: There is diffuse gastric wall thickening, especially in the antrum. No small bowel abnormalities are identified. The appendix is not visualized. There is questionable circumferential wall thickening of the distal transverse colon (axial image 42). There is no evidence of associated bowel obstruction. However, there is an adjacent bilobed mass measuring up to 3.5 x 3.2 cm transverse on image 38. This has fat density along its periphery and could reflect an atypical area of  fat necrosis. Within the base the mesentery, there is a low-density structure measuring 2.3 x 2.7 cm. This is adjacent to a surgical clip and could reflect a postoperative finding. There is no ascites.  Retroperitoneum/Pelvis: There are no enlarged abdominal or pelvic lymph nodes. There is mild aortoiliac atherosclerosis. 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 In addition, the splenic vein appears chronically occluded. There are numerous left upper quadrant abdominal varices. In addition, there are multiple large varices of the right ovarian vein. No adnexal mass is seen. There is a small calcified uterine fibroid.  Abdominal wall: Postsurgical changes are present within the anterior  abdominal wall. There is no evidence of abdominal wall mass or hernia.  Musculoskeletal: No acute or significant osseus findings.  IMPRESSION: 1. No evidence of acute pancreatitis. The pancreatic body and tail are markedly diminutive, possibly secondary to prior pancreatitis or congenital variant. 2. 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 3. Omental/mesenteric masses concerning for malignancy. In addition, there are multiple ill-defined low-density lesions distributed throughout the right hepatic lobe which are associated with intra and extrahepatic biliary dilatation. Infiltrating hepatic malignancy is a consideration. 4. Abdominal MRI without and with contrast may be helpful to further evaluate the liver. Alternatively, PET-CT could be performed to assess for metabolic activity within the liver and mesenteric lesions.   Electronically Signed   By: Camie Patience M.D.   On: 06/19/2014 07:44       Medications:    Infusions: . sodium chloride    . sodium chloride 75 mL/hr at 06/20/14 0024  . bivalirudin (ANGIOMAX) infusion 0.5 mg/mL (Non-ACS indications) 0.1 mg/kg/hr (06/19/14 2208)    Scheduled Medications: . insulin aspart  0-15 Units Subcutaneous 6 times per day  . lisinopril  20 mg Oral BID  . metoprolol tartrate  25 mg Oral BID  . ranitidine  150 mg Oral BID  . sodium chloride  3 mL Intravenous Q12H  . sotalol  80 mg Oral BID    PRN Medications: HYDROmorphone (DILAUDID) injection, ondansetron (ZOFRAN) IV, ondansetron   Assessment/ Plan:    Principal Problem:   Abdominal pain, epigastric Active Problems:   Elevated lipase   Hx of ischemic bowel disease   Diabetes   Liver masses   Thrombosis of mesenteric artery   Splenic infarction   Portal vein thrombosis   Portal hypertension   HTN (hypertension), benign   Atrial fibrillation   Other pancytopenia  #Abdominal pain with new masses on CT Patient  presented with epigastric abdominal pain, nausea, and vomiting with a history of acute pancreatitis in 6237 complicated by pseudocyst that had to be drained and previous cholecystectomy.  She also underwent exploratory laproscopy for ischemic colitis on 04/12/14.  Lipase >3000.  AST, ALT, and T bili elevated with mild elevation of Alk Phos. Lipid panel normal with no hypertriglyceridemia. CXR normal.  Abdominal US demonstrated CBD dilation up to 1.8 cm with intrahepatic biliary ductal dilation and a cystic lesion in R hepatic lobe measuring 1.7 x 1.5 cm.  Originally abdominal pain thought to be consistent with gallstone pancreatitis.  GI (Dr. Benson Norway) was consulted and recommended ERCP tomorrow for sphincterotomy.  CT A/P showed no evidence of acute pancreatitis, but showed omental and mesenteric masses along with multiple ill-defined lesions in R hepatic lobe concerning for malignancy. Hem/onc (Dr. Beryle Beams) consulted and recommended liver MRI based on radiology recs for further work-up. CT scan report from [facility] during hospitalization for  ischemic colitis on 04/12/14 obtained, which showed a SMA thrombus, splenic infarct, and CBD dilation without mention of abdominal masses or liver changes.  Records of hospitalization and operation also obtained.  She underwent surgery by gen surg and vascular surgery to remove SMA thrombus with no removal of ischemic bowel necessary.  Etiology of the new masses and liver findings unclear currently, but malignancy a concern given hypercoagulability. Perhaps, these changes are post-surgical fat necrosis, which could explain elevation in lipase.  Liver function improved slightly today.  Tumor markers, HIV, and acute hepatitis panel negative.  Lactate elevated at 2.3.  Pain, nausea, vomiting remain improved.  Dr. Benson Norway of GI canceled ERCP today and will consider tomorrow vs. EUS pending MRI results. -MRI liver with and without contrast today. -Possible ERCP vs. EUC by Dr.  Benson Norway tomorrow. -NPO for MRI this morning, consider advancing diet after MRI. -Dilaudid 0.5 mg q3h PRN.  #Acute onset pancytopenia WBC (6.9>2.4), Hgb (12.2>8.8), Platelets (123>54) all decreased after receiving only heparin and Dilaudid last night.  Concern for HIT, but last heparin exposure >30 days ago.  4 Ts score = 4 intermediate probability.  Hem/onc consulted and raised concern for early DIC, but no schistocytes on blood smear.  D-dimer elevated at 4.13, but fibrinogen normal. LDH elevated at 482.  Also could be due to acute liver failure.  In view of recent SMA thrombosis, hem/onc recommended argatroban until heparin antibody panel results back.  Pharmacy switched to bivalirudin due to decreased hepatic clearance. -No heparin. -HIT panel. -Bivalirudin per pharmacy consult. -Follow CBC. -Repeat blood smear, fibrinogen, d-dimer.  #Hypercoagulability Recent SMA thrombosis attributed to atrial fibrillation, however this is not a typical distribution of ischemia from a cardiac source.  Possible underlying hypercoagulability due to malignancy, congenital coagulopathy, or antiphospholipid antibody syndrome.  Antithrombin III activity slightly low, but this is expected due to recent heparin exposure and not evidence of congenital abnormality. -Hypercoagulability panel pending.  #Atrial fibrillation Unlikely to be cause of SMA thrombosis, on Eliquis and sotalol at home.  In sinus rthythm today.  Holding Eliquis for procedure and on bivalirudin currently due to concern for HIT. -Hold home Eliquis 5 mg BID. -Continue sotalol 80 mg BID to maintain sinus rhythm. -On telemetry.  #DM2 On metformin 500 mg BID and Lantus 30 units QHS with Humalog correction at home. Holding currently, CBG 158 this morning.  NPO this morning for procedure. -Holding Lantus and metformin. -SSI moderate.  #HTN Restarted home meds yesterday.  Patient reports taking metoprolol 25 mg daily at home, but discharge from  [facility] says BID.  Giving BID due to elevated BP.  BP elevated this morning, but has not had meds due to NPO for procedure today.  No crackles or edema on exam so holding Lasix for now. -Continue home lisinopril 20 mg BID, metoprolol 25 mg BID. -Holding furosemide 40 mg daily.  #HLD Lipid panel cholesterol 73, triglycerides 73, HDL 47, LDL 11. -Continue home Crestor 20 mg QHS.   DVT PPX - SCD's while in bed  CODE STATUS - Full code  CONSULTS PLACED - GI, Hem/onc  DISPO - Disposition is deferred at this time, awaiting work-up of CT findings.   Anticipated discharge in approximately 3 day(s).   The patient does have a current PCP Solmon Ice [phone]) and does not need an [facility] hospital follow-up appointment after discharge.    Is the [facility] hospital follow-up appointment a one-time only appointment? Not applicable.  Does the patient have transportation limitations that hinder transportation  to clinic appointments? no   SERVICE NEEDED AT DISCHARGE - TO BE DETERMINED DURING HOSPITAL COURSE         Y = Yes, Blank = No PT:   OT:   RN:   Equipment:   Other:      Length of Stay: 2 day(s)   Signed: Arman Filter, MD  PGY-1, Internal Medicine Resident Pager: [phone] (7AM-5PM) 06/20/2014, 8:04 AM

## 2014-06-20 NOTE — Progress Notes (Signed)
Patient's BP still elevated at 174/51 this morning after having home meds restarted yesterday.  Paged physician on-call and made aware.  No new orders received at this time.  Will continue to monitor.  Randell Patient

## 2014-06-20 NOTE — ED Provider Notes (Signed)
Medical screening examination/treatment/procedure(s) were conducted as a shared visit with non-physician practitioner(s) and myself.  I personally evaluated the patient during the encounter. Please see my previous note.    EKG Interpretation   Date/Time:  Tuesday June 18 2014 13:00:17 EDT Ventricular Rate:  50 PR Interval:  143 QRS Duration: 95 QT Interval:  458 QTC Calculation: 418 R Axis:   -14 Text Interpretation:  Sinus rhythm Abnormal R-wave progression, early  transition LVH with secondary repolarization abnormality Baseline wander  No old tracing to compare Confirmed by [facility]  MD, Nunzio Cory [phone]) on  06/18/2014 1:33:50 PM        Francine Graven, DO 06/20/14 1549

## 2014-06-20 NOTE — Progress Notes (Signed)
Patient ID: Wendy Meza, female   DOB: Aug 29, 1941, 73 y.o.   MRN: [id number] Worsening liver functions and pattern looks more obstructive today Persistent but stable pancytopenia Normal CEA & AFP tumor markers Hepatitis A,B,C,HIV all negative. Pharmacy has appropriately changed from argatroban to bivalirudin given argatroban cleared in the liver. At this point I would consider that pancytopenia is secondary to progressive liver failure related to recent mesenteric vascular thrombosis and not heparin related but I would continue bivalirudin pending results of HIT testing and MRI. Liver failure may in turn, all be secondary to to vascular infarction although LDH only moderately elevated.

## 2014-06-20 NOTE — Progress Notes (Signed)
Internal Medicine Attending  Date: 06/20/2014  Patient name: Wendy Meza Medical record number: [id number] Date of birth: 05/11/1941 Age: 73 y.o. Gender: female  I saw and evaluated the patient, and discussed her care with resident on A.M rounds.  I reviewed the resident's note by Dr. Trudee Kuster and I agree with the resident's findings and plans as documented in his note, with the following additional comments.  Dr. Trudee Kuster, Dr. Colin Rhein, and I reviewed the patient's MRI scan with Dr. Zigmund Daniel in radiology this afternoon and discussed patient at length with him.  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  He suggested conservative management for now with a followup MRI scan in about 6 weeks rather than proceeding immediately to biopsy given the potential risk involved.  Patient's abdominal pain has resolved; lipase has dramatically improved, and liver enzymes are somewhat improved compared to yesterday.  Her pancytopenia is stable.  We are still awaiting lab test results for hypercoagulability and HIT.

## 2014-06-20 NOTE — Progress Notes (Signed)
Subjective: Feels well.  No abdominal pain.  Objective: Vital signs in last 24 hours: Temp:  [98.3 F (36.8 C)-98.9 F (37.2 C)] 98.5 F (36.9 C) (08/13 0420) Pulse Rate:  [53-59] 53 (08/13 0420) Resp:  [17-18] 18 (08/13 0420) BP: (167-174)/(43-53) 174/51 mmHg (08/13 0420) SpO2:  [93 %-100 %] 93 % (08/13 0420) Last BM Date: 06/18/14  Intake/Output from previous day: 08/12 0701 - 08/13 0700 In: 851.7 [P.O.:240; I.V.:611.7] Out: 2300 [Urine:2300] Intake/Output this shift:    General appearance: alert and no distress GI: soft, non-tender; bowel sounds normal; no masses,  no organomegaly  Lab Results:  Recent Labs  06/19/14 0706 06/19/14 1500 06/20/14 0017  WBC 2.4* 2.7* 2.7*  HGB 8.8* 9.9* 10.0*  HCT 25.7* 29.2* 29.3*  PLT 54* 58* 62*   BMET  Recent Labs  06/18/14 1333 06/18/14 1358 06/20/14 0017  NA 140 138 140  K 4.7 4.3 3.5*  CL 101 103 104  CO2 22  --  27  GLUCOSE 392* 405* 184*  BUN 18 21 13   CREATININE 0.85 1.00 0.68  CALCIUM 9.2  --  8.5   LFT  Recent Labs  06/19/14 0805 06/20/14 0017  PROT 6.1 5.7*  ALBUMIN 3.3* 3.1*  AST 1217* 494*  ALT 787* 576*  ALKPHOS 186* 228*  BILITOT 4.0* 4.1*  BILIDIR 1.4*  --   IBILI 2.6*  --    PT/INR  Recent Labs  06/18/14 2147  LABPROT 22.8*  INR 2.01*   Hepatitis Panel  Recent Labs  06/19/14 1240  HEPBSAG NEGATIVE  HCVAB NEGATIVE  HEPAIGM NON REACTIVE  HEPBIGM NON REACTIVE   C-Diff No results found for this basename: CDIFFTOX,  in the last 72 hours Fecal Lactopherrin No results found for this basename: FECLLACTOFRN,  in the last 72 hours  Studies/Results: Dg Chest 2 View  06/18/2014   CLINICAL DATA:  Chest pain  EXAM: CHEST  2 VIEW  COMPARISON:  None.  FINDINGS: The cardiac and mediastinal silhouettes are stable in size and contour, and remain within normal limits.  The lungs are normally inflated. No airspace consolidation, pleural effusion, or pulmonary edema is identified. There is no  pneumothorax.  No acute osseous abnormality identified. Minimal degenerative spurring noted within the mid thoracic spine. Surgical clips overlie the left axilla.  IMPRESSION: No active cardiopulmonary disease.   Electronically Signed   By: Jeannine Boga M.D.   On: 06/18/2014 14:00   US Abdomen Complete  06/18/2014   CLINICAL DATA:  Upper abdominal pain, elevated LFTs  EXAM: ULTRASOUND ABDOMEN COMPLETE  COMPARISON:  None.  FINDINGS: Gallbladder:  Surgically absent  Common bile duct:  Diameter: CBD dilatation with CBD measuring 18.3 mm.  Liver:  There is heterogeneous liver with intrahepatic biliary ductal dilatation. Hypoechoic cystic lesion in right hepatic lobe centrally measures 1.7 x 1.5 cm.  IVC:  No abnormality visualized.  Pancreas:  Limited assessment due to abundant bowel gas  Spleen:  Size and appearance within normal limits.  Right Kidney:  Length: 11 cm. Echogenicity within normal limits. No mass or hydronephrosis visualized.  Left Kidney:  Length: 12.9 cm. Echogenicity within normal limits. No mass or hydronephrosis visualized.  Bilateral renal cortical thinning probable due to atrophy.  Abdominal aorta:  No aneurysm visualized.  Measures up to 2.7 cm in diameter.  Other findings:  None.  IMPRESSION: 1. Surgically absent gallbladder.  CBD dilatation up to 1.8 cm. 2. There is intrahepatic biliary ductal dilatation. Cystic lesion in right hepatic lobe centrally measures  1.7 x 1.5 cm. Further correlation with enhanced CT or MRI could be performed. 3. Bilateral renal cortical thinning probable due to atrophy. No hydronephrosis. No focal renal mass. 4. No aortic aneurysm.   Electronically Signed   By: Lahoma Crocker M.D.   On: 06/18/2014 16:18   Ct Abdomen Pelvis W Contrast  06/19/2014   CLINICAL DATA:  Abdominal pain. History of pancreatitis, diabetes and hypertension.  EXAM: CT ABDOMEN AND PELVIS WITH CONTRAST  TECHNIQUE: Multidetector CT imaging of the abdomen and pelvis was performed using the  standard protocol following bolus administration of intravenous contrast.  CONTRAST:  80 ml Omnipaque 300.  COMPARISON:  Abdominal ultrasound same date.  FINDINGS: Lung bases: Clear. No significant pleural or pericardial effusion.  Liver/Biliary/Pancreas: The gallbladder is surgically absent. There is moderate intra and extrahepatic biliary dilatation. The common hepatic duct measures up to 1.7 cm in diameter. The common bile duct tapers distally within the pancreatic head. No calcified intraductal stone or pancreatic mass is identified. There is no evidence of pancreatitis, pancreatic ductal dilatation or surrounding fluid collection. The pancreatic head appears normal. The pancreatic body and tail are markedly diminutive. Multiple ill-defined low-density lesions are present throughout the right hepatic lobe. There is no well-defined mass.  Spleen/Adrenal glands: The spleen is enlarged with mildly heterogeneous density. No focal lesions are identified. There is no adrenal mass.  Kidneys/Ureters/Bladder: There is a small nonobstructing calculus in the mid right kidney. No other urinary tract calculi are demonstrated. There is no evidence of renal mass, hydronephrosis or bladder lesion.  Bowel/Peritoneum: There is diffuse gastric wall thickening, especially in the antrum. No small bowel abnormalities are identified. The appendix is not visualized. There is questionable circumferential wall thickening of the distal transverse colon (axial image 42). There is no evidence of associated bowel obstruction. However, there is an adjacent bilobed mass measuring up to 3.5 x 3.2 cm transverse on image 38. This has fat density along its periphery and could reflect an atypical area of fat necrosis. Within the base the mesentery, there is a low-density structure measuring 2.3 x 2.7 cm. This is adjacent to a surgical clip and could reflect a postoperative finding. There is no ascites.  Retroperitoneum/Pelvis: There are no enlarged  abdominal or pelvic lymph nodes. There is mild aortoiliac atherosclerosis. 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 In addition, the splenic vein appears chronically occluded. There are numerous left upper quadrant abdominal varices. In addition, there are multiple large varices of the right ovarian vein. No adnexal mass is seen. There is a small calcified uterine fibroid.  Abdominal wall: Postsurgical changes are present within the anterior abdominal wall. There is no evidence of abdominal wall mass or hernia.  Musculoskeletal: No acute or significant osseus findings.  IMPRESSION: 1. No evidence of acute pancreatitis. The pancreatic body and tail are markedly diminutive, possibly secondary to prior pancreatitis or congenital variant. 2. 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 3. Omental/mesenteric masses concerning for malignancy. In addition, there are multiple ill-defined low-density lesions distributed throughout the right hepatic lobe which are associated with intra and extrahepatic biliary dilatation. Infiltrating hepatic malignancy is a consideration. 4. Abdominal MRI without and with contrast may be helpful to further evaluate the liver. Alternatively, PET-CT could be performed to assess for metabolic activity within the liver and mesenteric lesions.   Electronically Signed   By: Camie Patience M.D.   On: 06/19/2014 07:44    Medications:  Scheduled: . insulin aspart  0-15 Units Subcutaneous 6 times per day  . lisinopril  20 mg Oral BID  . metoprolol tartrate  25 mg Oral BID  . ranitidine  150 mg Oral BID  . sodium chloride  3 mL Intravenous Q12H  . sotalol  80 mg Oral BID   Continuous: . sodium chloride    . sodium chloride 75 mL/hr at 06/20/14 0024  . bivalirudin (ANGIOMAX) infusion 0.5 mg/mL (Non-ACS indications) 0.1 mg/kg/hr (06/19/14 2208)    Assessment/Plan: 1) Abnormal liver  enzymes. 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 3) Abnormal CT scan.   The patient's clinical picture is very confusing at this time point.  I reviewed the CT scan from [place] performed 04/12/2014 and her liver panel during that hospitalization.  She had biliary ductal dilation, but no evidence of any stones or masses.  There was the finding of the thickened transverse colon that extended down to the sigmoid colon.  The mild circumfirential wall thickening is noted during this admission's CT scan.  The liver panel has also fluctuated.  This AM her liver panel is consistent with an obstructive pattern as the TB and AP are elevated.  Plan: 1) Cancel ERCP.  ? ERCP and/or EUS tomorrow. 2) Await MRCP/MRI. 3) Follow liver panel.  LOS: 2 days   Jaylie Neaves D 06/20/2014, 7:14 AM

## 2014-06-21 ENCOUNTER — Encounter (HOSPITAL_COMMUNITY): Payer: Self-pay | Admitting: Gastroenterology

## 2014-06-21 LAB — CBC
HCT: 31.9 % — ABNORMAL LOW (ref 36.0–46.0)
HEMOGLOBIN: 10.5 g/dL — AB (ref 12.0–15.0)
MCH: 28.9 pg (ref 26.0–34.0)
MCHC: 32.9 g/dL (ref 30.0–36.0)
MCV: 87.9 fL (ref 78.0–100.0)
Platelets: 84 10*3/uL — ABNORMAL LOW (ref 150–400)
RBC: 3.63 MIL/uL — ABNORMAL LOW (ref 3.87–5.11)
RDW: 14.6 % (ref 11.5–15.5)
WBC: 3.3 10*3/uL — ABNORMAL LOW (ref 4.0–10.5)

## 2014-06-21 LAB — PROTEIN S, TOTAL: Protein S Ag, Total: 75 % (ref 60–150)

## 2014-06-21 LAB — COMPREHENSIVE METABOLIC PANEL
ALT: 355 U/L — ABNORMAL HIGH (ref 0–35)
ANION GAP: 11 (ref 5–15)
AST: 126 U/L — AB (ref 0–37)
Albumin: 2.9 g/dL — ABNORMAL LOW (ref 3.5–5.2)
Alkaline Phosphatase: 268 U/L — ABNORMAL HIGH (ref 39–117)
BUN: 11 mg/dL (ref 6–23)
CALCIUM: 8.2 mg/dL — AB (ref 8.4–10.5)
CO2: 22 meq/L (ref 19–32)
CREATININE: 0.62 mg/dL (ref 0.50–1.10)
Chloride: 107 mEq/L (ref 96–112)
GFR calc Af Amer: >90 mL/min (ref >90)
GFR, EST NON AFRICAN AMERICAN: 89 mL/min — AB (ref >90)
Glucose, Bld: 225 mg/dL — ABNORMAL HIGH (ref 70–99)
Potassium: 4 mEq/L (ref 3.7–5.3)
Sodium: 140 mEq/L (ref 137–147)
Total Bilirubin: 2.7 mg/dL — ABNORMAL HIGH (ref 0.3–1.2)
Total Protein: 5.6 g/dL — ABNORMAL LOW (ref 6.0–8.3)

## 2014-06-21 LAB — GLUCOSE, CAPILLARY
GLUCOSE-CAPILLARY: 192 mg/dL — AB (ref 70–99)
GLUCOSE-CAPILLARY: 229 mg/dL — AB (ref 70–99)
GLUCOSE-CAPILLARY: 238 mg/dL — AB (ref 70–99)
GLUCOSE-CAPILLARY: 289 mg/dL — AB (ref 70–99)
Glucose-Capillary: 160 mg/dL — ABNORMAL HIGH (ref 70–99)
Glucose-Capillary: 313 mg/dL — ABNORMAL HIGH (ref 70–99)
Glucose-Capillary: 325 mg/dL — ABNORMAL HIGH (ref 70–99)

## 2014-06-21 LAB — APTT: APTT: 62 s — AB (ref 24–37)

## 2014-06-21 LAB — PROTEIN C, TOTAL: Protein C, Total: 73 % (ref 72–160)

## 2014-06-21 MED ORDER — METOPROLOL TARTRATE 1 MG/ML IV SOLN
5.0000 mg | Freq: Once | INTRAVENOUS | Status: AC
  Administered 2014-06-21: 5 mg via INTRAVENOUS
  Filled 2014-06-21: qty 5

## 2014-06-21 MED ORDER — MAGNESIUM CITRATE PO SOLN
1.0000 | Freq: Once | ORAL | Status: AC
  Administered 2014-06-21: 1 via ORAL
  Filled 2014-06-21: qty 296

## 2014-06-21 MED ORDER — INSULIN ASPART 100 UNIT/ML ~~LOC~~ SOLN
0.0000 [IU] | Freq: Three times a day (TID) | SUBCUTANEOUS | Status: DC
  Administered 2014-06-21: 5 [IU] via SUBCUTANEOUS
  Administered 2014-06-22: 2 [IU] via SUBCUTANEOUS
  Administered 2014-06-22: 8 [IU] via SUBCUTANEOUS
  Administered 2014-06-22: 2 [IU] via SUBCUTANEOUS
  Administered 2014-06-23: 5 [IU] via SUBCUTANEOUS
  Administered 2014-06-23: 2 [IU] via SUBCUTANEOUS
  Administered 2014-06-23: 8 [IU] via SUBCUTANEOUS
  Administered 2014-06-23: 15 [IU] via SUBCUTANEOUS
  Administered 2014-06-24: 2 [IU] via SUBCUTANEOUS

## 2014-06-21 MED ORDER — SODIUM CHLORIDE 0.9 % IV SOLN: INTRAVENOUS | Status: DC

## 2014-06-21 MED ORDER — INSULIN GLARGINE 100 UNIT/ML ~~LOC~~ SOLN
15.0000 [IU] | Freq: Every day | SUBCUTANEOUS | Status: DC
  Administered 2014-06-21: 15 [IU] via SUBCUTANEOUS
  Filled 2014-06-21 (×2): qty 0.15

## 2014-06-21 MED ORDER — MORPHINE SULFATE 2 MG/ML IJ SOLN: 0.5000 mg | INTRAMUSCULAR | Status: DC | PRN

## 2014-06-21 MED ORDER — FAMOTIDINE 20 MG PO TABS
20.0000 mg | ORAL_TABLET | Freq: Two times a day (BID) | ORAL | Status: DC
  Administered 2014-06-21 – 2014-06-24 (×7): 20 mg via ORAL
  Filled 2014-06-21 (×8): qty 1

## 2014-06-21 MED ORDER — PEG 3350-KCL-NA BICARB-NACL 420 G PO SOLR
4000.0000 mL | Freq: Once | ORAL | Status: AC
  Administered 2014-06-21: 4000 mL via ORAL
  Filled 2014-06-21: qty 4000

## 2014-06-21 MED ORDER — FUROSEMIDE 40 MG PO TABS
40.0000 mg | ORAL_TABLET | Freq: Every day | ORAL | Status: DC
  Administered 2014-06-21 – 2014-06-24 (×4): 40 mg via ORAL
  Filled 2014-06-21 (×4): qty 1

## 2014-06-21 NOTE — Progress Notes (Signed)
Patient ID: Wendy Meza, female   DOB: 03-30-1941, 73 y.o.   MRN: [id number] Clinically stable.  No further abdominal pain. Abdomen soft, non-tender. Bile duct could not be cannulated for technical reasons at time of ERCP. Stomach appeared normal.  No suspicion of gastric cancer to correlate with antral thickening seen on CT;  Pancreatic duct looks normal on MRI. 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 Although occult malignancy still a possibility, no obvious source found. CT & MRI reviewed in detail with Radiologist along with medical resident and attending. His impression is that most of the abnormal changes are likely secondary to sequelae of acute thrombotic events. Lesion closest to abdominal wall may be clot in a pseudoanyrsm.  Recommend: If HIT test negative, I would put patient on lovenox 1.5 mg/kg/day and stop bivalirudin.  After 1-2 weeks, if platelets and liver functions improving, change to warfarin. I would not resume apixaban or any other new oral target specific agent. We have no data on efficacy in unusual site thrombosis and 2012 CHEST guidelines recommend traditional anticoagulants for these situations at this time. Repeat MRI abdomen 4-6 weeks. If any of the intra-abdominal or liver masses increasing in size, biopsy at that time. PET scanning is not approved for inpatient use or for cancer screening in absence of a tissue dx of cancer.

## 2014-06-21 NOTE — Progress Notes (Addendum)
CARE MANAGEMENT NOTE 06/21/2014  Patient:  Wendy Meza, Wendy Meza   Account Number:  [id number]  Date Initiated:  06/19/2014  Documentation initiated by:  Lorne Skeens  Subjective/Objective Assessment:   Patient was admitted with epigastric abdominal pain. Currently visiting [place] from [place].  Lives at home with spouse, staying with son while in [place].     Action/Plan:   Will follow for discharge needs.   Anticipated DC Date:  06/22/2014   Anticipated DC Plan:  [place]  CM consult      [facility] Choice  HOME HEALTH   Choice offered to / List presented to:  C-1 Patient        [place] arranged  [place] - 11 Patient Refused      Status of service:  Completed, signed off Medicare Important Message given?  YES (If response is "NO", the following Medicare IM given date fields will be blank) Date Medicare IM given:  06/21/2014 Medicare IM given by:  [facility] Date Additional Medicare IM given:   Additional Medicare IM given by:    Discharge Disposition:  HOME/SELF CARE  Per UR Regulation:  Reviewed for med. necessity/level of care/duration of stay  If discussed at [place] of Stay Meetings, dates discussed:    Comments:  06/21/2014 1300 NCM spoke to pt and she signed release form to send medical records to her PCP's office. States she does not want HH if recommended at dc. States she has RW at home. She will stay with her son until she travels back to [facility] in app 2 weeks. No other NCM needs identified. Will continue to follow until dc. Jonnie Finner RN CCM Case Mgmt phone [phone]  06/21/2014 1200 NCM received call from pt's PCP, office Dr. Guerry Minors. Spoke to RN CM, Santiago Glad. Requested pt's progress notes, op notes, dc summary and lab reports. Pt signed release of medical records to PCP's office. States [facility] # [id number] is used for [facility] in their area. Jonnie Finner RN CCM Case Mgmt phone [phone]  PCP [place], Dr. Guerry Minors. Fax [phone]

## 2014-06-21 NOTE — Progress Notes (Addendum)
Subjective: Feeling well.  No complaints.  Objective: Vital signs in last 24 hours: Temp:  [97.8 F (36.6 C)-98.2 F (36.8 C)] 98.2 F (36.8 C) (08/14 0359) Pulse Rate:  [43-117] 52 (08/14 0359) Resp:  [10-16] 16 (08/14 0359) BP: (110-190)/(31-120) 186/79 mmHg (08/14 0359) SpO2:  [97 %-100 %] 97 % (08/14 0359) Last BM Date: 06/18/14  Intake/Output from previous day: 08/13 0701 - 08/14 0700 In: 100 [IV Piggyback:100] Out: 900 [Urine:900] Intake/Output this shift:    General appearance: alert and no distress GI: soft, non-tender; bowel sounds normal; no masses,  no organomegaly  Lab Results:  Recent Labs  06/19/14 1500 06/20/14 0017 06/21/14 0410  WBC 2.7* 2.7* 3.3*  HGB 9.9* 10.0* 10.5*  HCT 29.2* 29.3* 31.9*  PLT 58* 62* 84*   BMET  Recent Labs  06/18/14 1333 06/18/14 1358 06/20/14 0017  NA 140 138 140  K 4.7 4.3 3.5*  CL 101 103 104  CO2 22  --  27  GLUCOSE 392* 405* 184*  BUN 18 21 13   CREATININE 0.85 1.00 0.68  CALCIUM 9.2  --  8.5   LFT  Recent Labs  06/19/14 0805 06/20/14 0017  PROT 6.1 5.7*  ALBUMIN 3.3* 3.1*  AST 1217* 494*  ALT 787* 576*  ALKPHOS 186* 228*  BILITOT 4.0* 4.1*  BILIDIR 1.4*  --   IBILI 2.6*  --    PT/INR  Recent Labs  06/18/14 2147  LABPROT 22.8*  INR 2.01*   Hepatitis Panel  Recent Labs  06/19/14 1240  HEPBSAG NEGATIVE  HCVAB NEGATIVE  HEPAIGM NON REACTIVE  HEPBIGM NON REACTIVE   C-Diff No results found for this basename: CDIFFTOX,  in the last 72 hours Fecal Lactopherrin No results found for this basename: FECLLACTOFRN,  in the last 72 hours  Studies/Results: Mr Liver W Wo Contrast  06/20/2014   CLINICAL DATA:  CT demonstrating liver lesions. History pancreatitis. Diabetes and hypertension.  EXAM: MRI ABDOMEN WITHOUT AND WITH CONTRAST (INCLUDING MRCP)  TECHNIQUE: Multiplanar multisequence MR imaging of the abdomen was performed both before and after the administration of intravenous contrast. Heavily  T2-weighted images of the biliary and pancreatic ducts were obtained, and three-dimensional MRCP images were rendered by post processing.  CONTRAST:  36mL MULTIHANCE GADOBENATE DIMEGLUMINE 529 MG/ML IV SOLN  COMPARISON:  CT dated 06/18/2014.  FINDINGS: Mild cardiomegaly, without pericardial or pleural effusion.  No definite evidence of cirrhosis. Nonspecific focus of precontrast T1 hyperintensity within the high right lobe of the liver on image 26/series 1500. Heterogeneous enhancement with multi focal hypoenhancing/cystic foci throughout the dome of the right lobe of the liver, extending more inferiorly to straddle the anterior and posterior segments. This area is somewhat ill-defined, but measures on the order of 9.2 x 9.1 cm on image 24/series 15002. Vessels and dilated intrahepatic ducts appear to traverse this area, including on coronal image 30 of series 16.  A more vague area of similar morphology extends into the posterior segment right lobe of the liver on image 30/series 15003.  Nonspecific focus of arterial hyper enhancement in the subcapsular right lobe on image 41/series 1501. Likely a perfusion anomaly.  Mild splenomegaly, greater than 13 cm craniocaudal.  Small hiatal hernia.  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 There is no pancreatic ductal dilatation or dominant pancreatic mass. Moderate common duct dilatation, including at 1.6 cm on image 50/series 6. Mild to moderate intrahepatic ductal dilatation. No obstructive stone or mass identified.  Cavernous transformation of the portal  vein again identified. Normal adrenal glands and kidneys.  Incompletely imaged are peritoneal implants, including within the gastrocolic ligament 3.2 cm on image 61/series 16. No ascites.  IMPRESSION: 1. Biliary ductal dilatation without obstructive stone or mass identified. Cannot exclude ampullary stenosis or otherwise occult ampullary lesion. ERCP should be considered. 2. Ill-defined vague  area of high right liver lobe heterogeneous enhancement and multi focal cystic change. Considerations include an atypical appearance of infiltrative hepatocellular carcinoma, peribiliary cysts superimposed upon biliary ductal dilatation, or an atypical entity such as hepatic peliosis. 3. Incompletely imaged peritoneal masses. 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 Recommend further evaluation with PET to direct biopsy. This will also allow evaluation of the liver more entirely. 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   Electronically Signed   By: Abigail Miyamoto M.D.   On: 06/20/2014 10:50   Mr 3d Recon At Scanner  06/20/2014   CLINICAL DATA:  CT demonstrating liver lesions. History pancreatitis. Diabetes and hypertension.  EXAM: MRI ABDOMEN WITHOUT AND WITH CONTRAST (INCLUDING MRCP)  TECHNIQUE: Multiplanar multisequence MR imaging of the abdomen was performed both before and after the administration of intravenous contrast. Heavily T2-weighted images of the biliary and pancreatic ducts were obtained, and three-dimensional MRCP images were rendered by post processing.  CONTRAST:  41mL MULTIHANCE GADOBENATE DIMEGLUMINE 529 MG/ML IV SOLN  COMPARISON:  CT dated 06/18/2014.  FINDINGS: Mild cardiomegaly, without pericardial or pleural effusion.  No definite evidence of cirrhosis. Nonspecific focus of precontrast T1 hyperintensity within the high right lobe of the liver on image 26/series 1500. Heterogeneous enhancement with multi focal hypoenhancing/cystic foci throughout the dome of the right lobe of the liver, extending more inferiorly to straddle the anterior and posterior segments. This area is somewhat ill-defined, but measures on the order of 9.2 x 9.1 cm on image 24/series 15002. Vessels and dilated intrahepatic ducts appear to traverse this area, including on coronal image 30 of series 16.  A more vague area of similar morphology extends into the posterior segment right lobe of  the liver on image 30/series 15003.  Nonspecific focus of arterial hyper enhancement in the subcapsular right lobe on image 41/series 1501. Likely a perfusion anomaly.  Mild splenomegaly, greater than 13 cm craniocaudal.  Small hiatal hernia.  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 There is no pancreatic ductal dilatation or dominant pancreatic mass. Moderate common duct dilatation, including at 1.6 cm on image 50/series 6. Mild to moderate intrahepatic ductal dilatation. No obstructive stone or mass identified.  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 Normal adrenal glands and kidneys.  Incompletely imaged are peritoneal implants, including within the gastrocolic ligament 3.2 cm on image 61/series 16. No ascites.  IMPRESSION: 1. Biliary ductal dilatation without obstructive stone or mass identified. Cannot exclude ampullary stenosis or otherwise occult ampullary lesion. ERCP should be considered. 2. Ill-defined vague area of high right liver lobe heterogeneous enhancement and multi focal cystic change. Considerations include an atypical appearance of infiltrative hepatocellular carcinoma, peribiliary cysts superimposed upon biliary ductal dilatation, or an atypical entity such as hepatic peliosis. 3. Incompletely imaged peritoneal masses. 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 Recommend further evaluation with PET to direct biopsy. This will also allow evaluation of the liver more entirely. 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   Electronically Signed   By: Abigail Miyamoto M.D.   On: 06/20/2014 10:50   Mr Abd W/wo Cm/mrcp  06/20/2014   CLINICAL DATA:  CT demonstrating liver lesions. History pancreatitis. Diabetes and hypertension.  EXAM: MRI ABDOMEN WITHOUT AND WITH CONTRAST (INCLUDING MRCP)  TECHNIQUE: Multiplanar multisequence MR imaging of the abdomen was performed both before and after the administration of intravenous  contrast. Heavily T2-weighted images of the biliary and pancreatic ducts were obtained, and three-dimensional MRCP images were rendered by post processing.  CONTRAST:  67mL MULTIHANCE GADOBENATE DIMEGLUMINE 529 MG/ML IV SOLN  COMPARISON:  CT dated 06/18/2014.  FINDINGS: Mild cardiomegaly, without pericardial or pleural effusion.  No definite evidence of cirrhosis. Nonspecific focus of precontrast T1 hyperintensity within the high right lobe of the liver on image 26/series 1500. Heterogeneous enhancement with multi focal hypoenhancing/cystic foci throughout the dome of the right lobe of the liver, extending more inferiorly to straddle the anterior and posterior segments. This area is somewhat ill-defined, but measures on the order of 9.2 x 9.1 cm on image 24/series 15002. Vessels and dilated intrahepatic ducts appear to traverse this area, including on coronal image 30 of series 16.  A more vague area of similar morphology extends into the posterior segment right lobe of the liver on image 30/series 15003.  Nonspecific focus of arterial hyper enhancement in the subcapsular right lobe on image 41/series 1501. Likely a perfusion anomaly.  Mild splenomegaly, greater than 13 cm craniocaudal.  Small hiatal hernia.  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 There is no pancreatic ductal dilatation or dominant pancreatic mass. Moderate common duct dilatation, including at 1.6 cm on image 50/series 6. Mild to moderate intrahepatic ductal dilatation. No obstructive stone or mass identified.  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 Normal adrenal glands and kidneys.  Incompletely imaged are peritoneal implants, including within the gastrocolic ligament 3.2 cm on image 61/series 16. No ascites.  IMPRESSION: 1. Biliary ductal dilatation without obstructive stone or mass identified. Cannot exclude ampullary stenosis or otherwise occult ampullary lesion. ERCP should be considered. 2.  Ill-defined vague area of high right liver lobe heterogeneous enhancement and multi focal cystic change. Considerations include an atypical appearance of infiltrative hepatocellular carcinoma, peribiliary cysts superimposed upon biliary ductal dilatation, or an atypical entity such as hepatic peliosis. 3. Incompletely imaged peritoneal masses. 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 Recommend further evaluation with PET to direct biopsy. This will also allow evaluation of the liver more entirely. 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   Electronically Signed   By: Abigail Miyamoto M.D.   On: 06/20/2014 10:50    Medications:  Scheduled: . insulin aspart  0-15 Units Subcutaneous 6 times per day  . insulin glargine  10 Units Subcutaneous QHS  . lisinopril  20 mg Oral BID  . metoprolol tartrate  12.5 mg Oral BID  . ranitidine  150 mg Oral BID  . sodium chloride  3 mL Intravenous Q12H  . sotalol  80 mg Oral BID   Continuous: . sodium chloride 75 mL/hr at 06/20/14 1044  . bivalirudin (ANGIOMAX) infusion 0.5 mg/mL (Non-ACS indications) 0.1 mg/kg/hr (06/19/14 2208)    Assessment/Plan: 1) Elevated liver panel and TB.  Today's results pending. 2) Dilated CBD. 3) Mesenteric arterial and venous thrombosis.   The patient's liver panel is pending at this time.  She is a difficult management issue.  If her AP and TB continue to climb I will have to discuss options with Interventional Radiology.  It will be difficult to manage with her anticoagulation.  No clear evidence of malignancy per Dr. Beryle Beams, which is good news.  Plan: 1) Await liver panel.  I will make further recommendations pending the results.  ADDENDUM:  The TB has dropped as well as her AST/ALT.  There is a mild increase in her AP.  In light of these numbers and her asymptomatic state from the biliary standpoint, I will hold off on any intervention.  Ideally she should follow up with me in the office  upon discharge, but she lives in [place].  I've encouraged her to seek out a GI physician upon her return.  LOS: 3 days   Syriana Croslin D 06/21/2014, 8:55 AM

## 2014-06-21 NOTE — Progress Notes (Addendum)
Pt started on bowel prep at 1645, consent on chart, request to sit on BSC while prepping per pt.

## 2014-06-21 NOTE — Progress Notes (Signed)
Internal Medicine Attending  Date: 06/21/2014  Patient name: Wendy Meza Medical record number: [id number] Date of birth: 27-May-1941 Age: 73 y.o. Gender: female  I saw and evaluated the patient. I discussed patient and reviewed the resident's note by Dr. Trudee Kuster, and I agree with the resident's findings and plans as documented in his note.  We discussed patient at length with Dr. Beryle Beams today.  Agree with plan for colonoscopy to be done tomorrow.

## 2014-06-21 NOTE — Progress Notes (Signed)
Subjective:    Currently, the patient feels great.  She reports some nausea yesterday after her MRI and ERCP that has resolved with no emesis today.  Her abdominal pain has completely resolved.  She currenlty denies nausea, headache, bleeding, bruising, dizziness, fevers, chills, chest pain, or shortness of breath.  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 -ERCP attempted by Dr. Benson Norway but major papilla deeply inset in a diverticulum and could not be accessed. -Hypercoagulation work-up negative to date. -LFTs and pancytopenia improving, platelet count to 84. -Blood pressure elevated to 186/79 without home furosemide. -Tolerating full diet.   Objective:    Vital Signs:   Temp:  [97.8 F (36.6 C)-98.2 F (36.8 C)] 98.2 F (36.8 C) (08/14 0359) Pulse Rate:  [43-117] 62 (08/14 1015) Resp:  [10-16] 16 (08/14 0359) BP: (110-190)/(31-120) 186/79 mmHg (08/14 0359) SpO2:  [97 %-100 %] 97 % (08/14 0359) Last BM Date: 06/18/14  24-hour weight change: Weight change:   Intake/Output:   Intake/Output Summary (Last 24 hours) at 06/21/14 1234 Last data filed at 06/21/14 1024  Gross per 24 hour  Intake      0 ml  Output    900 ml  Net   -900 ml      Physical Exam: General: Vital signs reviewed and noted. Well-developed, well-nourished, in no acute distress; alert, appropriate and cooperative throughout examination.  Lungs:  Normal respiratory effort. Clear to auscultation BL without crackles or wheezes.  Heart: RRR. S1 and S2 normal without gallop, murmur, or rubs.  Abdomen:  Organomegaly not appreciated on exam. BS normoactive. Soft, non-distended, non-tender.  Extremities: No pretibial edema. Skin scars on lower extremities bilaterally with skin graft on left shin.     Labs:  Basic Metabolic Panel:  Recent Labs Lab 06/18/14 1333 06/18/14 1358 06/20/14 0017 06/21/14 0844  NA  140 138 140 140  K 4.7 4.3 3.5* 4.0  CL 101 103 104 107  CO2 22  --  27 22  GLUCOSE 392* 405* 184* 225*  BUN 18 21 13 11   CREATININE 0.85 1.00 0.68 0.62  CALCIUM 9.2  --  8.5 8.2*    Liver Function Tests:  Recent Labs Lab 06/18/14 1333 06/19/14 0805 06/20/14 0017 06/21/14 0844  AST 357* 1217* 494* 126*  ALT 135* 787* 576* 355*  ALKPHOS 127* 186* 228* 268*  BILITOT 2.2* 4.0* 4.1* 2.7*  PROT 6.5 6.1 5.7* 5.6*  ALBUMIN 3.7 3.3* 3.1* 2.9*    Recent Labs Lab 06/18/14 1333 06/20/14 1132  LIPASE >3000* 85*   CBC:  Recent Labs Lab 06/18/14 1333 06/18/14 1358 06/19/14 0706 06/19/14 1500 06/20/14 0017 06/21/14 0410  WBC 6.9  --  2.4* 2.7* 2.7* 3.3*  NEUTROABS 5.5  --   --  1.7 1.7  --   HGB 12.2 12.6 8.8* 9.9* 10.0* 10.5*  HCT 36.4 37.0 25.7* 29.2* 29.3* 31.9*  MCV 86.9  --  84.8 86.6 85.9 87.9  PLT 123*  --  54* 58* 62* 84*   CBG:  Recent Labs Lab 06/20/14 2349 06/21/14 0027 06/21/14 0409 06/21/14 0819 06/21/14 1110  GLUCAP 289* 313* 160* 192* 325*   Coagulation Studies:  Recent Labs  06/18/14 2147  LABPROT 22.8*  INR 2.01*    Other results: Protein S activity 66%, borderline low. Protein C activity 92%. Lupus anticoagulant not detected. Antiphospholipid Abs negative. HIT pending. Beta-2-glycoprotein IgG, IgA, IgM 0, 37, 26.  Lab Results  Component Value Date   AFPTM 4.5 06/19/2014   Imaging: Mr Liver W Wo Contrast  06/20/2014   CLINICAL DATA:  CT demonstrating liver lesions. History pancreatitis. Diabetes and hypertension.  EXAM: MRI ABDOMEN WITHOUT AND WITH CONTRAST (INCLUDING MRCP)  TECHNIQUE: Multiplanar multisequence MR imaging of the abdomen was performed both before and after the administration of intravenous contrast. Heavily T2-weighted images of the biliary and pancreatic ducts were obtained, and three-dimensional MRCP images were rendered by post processing.  CONTRAST:  78m MULTIHANCE GADOBENATE DIMEGLUMINE 529 MG/ML IV SOLN   COMPARISON:  CT dated 06/18/2014.  FINDINGS: Mild cardiomegaly, without pericardial or pleural effusion.  No definite evidence of cirrhosis. Nonspecific focus of precontrast T1 hyperintensity within the high right lobe of the liver on image 26/series 1500. Heterogeneous enhancement with multi focal hypoenhancing/cystic foci throughout the dome of the right lobe of the liver, extending more inferiorly to straddle the anterior and posterior segments. This area is somewhat ill-defined, but measures on the order of 9.2 x 9.1 cm on image 24/series 15002. Vessels and dilated intrahepatic ducts appear to traverse this area, including on coronal image 30 of series 16.  A more vague area of similar morphology extends into the posterior segment right lobe of the liver on image 30/series 15003.  Nonspecific focus of arterial hyper enhancement in the subcapsular right lobe on image 41/series 1501. Likely a perfusion anomaly.  Mild splenomegaly, greater than 13 cm craniocaudal.  Small hiatal hernia.  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 There is no pancreatic ductal dilatation or dominant pancreatic mass. Moderate common duct dilatation, including at 1.6 cm on image 50/series 6. Mild to moderate intrahepatic ductal dilatation. No obstructive stone or mass identified.  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 Normal adrenal glands and kidneys.  Incompletely imaged are peritoneal implants, including within the gastrocolic ligament 3.2 cm on image 61/series 16. No ascites.  IMPRESSION: 1. Biliary ductal dilatation without obstructive stone or mass identified. Cannot exclude ampullary stenosis or otherwise occult ampullary lesion. ERCP should be considered. 2. Ill-defined vague area of high right liver lobe heterogeneous enhancement and multi focal cystic change. Considerations include an atypical appearance of infiltrative hepatocellular carcinoma, peribiliary cysts superimposed upon  biliary ductal dilatation, or an atypical entity such as hepatic peliosis. 3. Incompletely imaged peritoneal masses. 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 Recommend further evaluation with PET to direct biopsy. This will also allow evaluation of the liver more entirely. 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   Electronically Signed   By: KAbigail MiyamotoM.D.   On: 06/20/2014 10:50   Mr 3d Recon At Scanner  06/20/2014   CLINICAL DATA:  CT demonstrating liver lesions. History pancreatitis. Diabetes and hypertension.  EXAM: MRI ABDOMEN WITHOUT AND WITH CONTRAST (INCLUDING MRCP)  TECHNIQUE: Multiplanar multisequence MR imaging of the abdomen was performed both before and after the administration of intravenous contrast. Heavily T2-weighted images of the biliary and pancreatic ducts were obtained, and three-dimensional MRCP images were rendered by post processing.  CONTRAST:  128mMULTIHANCE GADOBENATE DIMEGLUMINE 529 MG/ML IV SOLN  COMPARISON:  CT dated 06/18/2014.  FINDINGS: Mild cardiomegaly, without pericardial or pleural effusion.  No definite evidence of cirrhosis. Nonspecific focus of precontrast T1 hyperintensity within the high right lobe of the liver on image 26/series 1500. Heterogeneous enhancement with multi focal hypoenhancing/cystic foci throughout the dome of the right lobe of the liver, extending more inferiorly to straddle the anterior and posterior segments. This  area is somewhat ill-defined, but measures on the order of 9.2 x 9.1 cm on image 24/series 15002. Vessels and dilated intrahepatic ducts appear to traverse this area, including on coronal image 30 of series 16.  A more vague area of similar morphology extends into the posterior segment right lobe of the liver on image 30/series 15003.  Nonspecific focus of arterial hyper enhancement in the subcapsular right lobe on image 41/series 1501. Likely a perfusion anomaly.  Mild splenomegaly, greater than 13 cm  craniocaudal.  Small hiatal hernia.  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 There is no pancreatic ductal dilatation or dominant pancreatic mass. Moderate common duct dilatation, including at 1.6 cm on image 50/series 6. Mild to moderate intrahepatic ductal dilatation. No obstructive stone or mass identified.  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 Normal adrenal glands and kidneys.  Incompletely imaged are peritoneal implants, including within the gastrocolic ligament 3.2 cm on image 61/series 16. No ascites.  IMPRESSION: 1. Biliary ductal dilatation without obstructive stone or mass identified. Cannot exclude ampullary stenosis or otherwise occult ampullary lesion. ERCP should be considered. 2. Ill-defined vague area of high right liver lobe heterogeneous enhancement and multi focal cystic change. Considerations include an atypical appearance of infiltrative hepatocellular carcinoma, peribiliary cysts superimposed upon biliary ductal dilatation, or an atypical entity such as hepatic peliosis. 3. Incompletely imaged peritoneal masses. 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 Recommend further evaluation with PET to direct biopsy. This will also allow evaluation of the liver more entirely. 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   Electronically Signed   By: Abigail Miyamoto M.D.   On: 06/20/2014 10:50   Mr Abd W/wo Cm/mrcp  06/20/2014   CLINICAL DATA:  CT demonstrating liver lesions. History pancreatitis. Diabetes and hypertension.  EXAM: MRI ABDOMEN WITHOUT AND WITH CONTRAST (INCLUDING MRCP)  TECHNIQUE: Multiplanar multisequence MR imaging of the abdomen was performed both before and after the administration of intravenous contrast. Heavily T2-weighted images of the biliary and pancreatic ducts were obtained, and three-dimensional MRCP images were rendered by post processing.  CONTRAST:  63m MULTIHANCE GADOBENATE DIMEGLUMINE 529  MG/ML IV SOLN  COMPARISON:  CT dated 06/18/2014.  FINDINGS: Mild cardiomegaly, without pericardial or pleural effusion.  No definite evidence of cirrhosis. Nonspecific focus of precontrast T1 hyperintensity within the high right lobe of the liver on image 26/series 1500. Heterogeneous enhancement with multi focal hypoenhancing/cystic foci throughout the dome of the right lobe of the liver, extending more inferiorly to straddle the anterior and posterior segments. This area is somewhat ill-defined, but measures on the order of 9.2 x 9.1 cm on image 24/series 15002. Vessels and dilated intrahepatic ducts appear to traverse this area, including on coronal image 30 of series 16.  A more vague area of similar morphology extends into the posterior segment right lobe of the liver on image 30/series 15003.  Nonspecific focus of arterial hyper enhancement in the subcapsular right lobe on image 41/series 1501. Likely a perfusion anomaly.  Mild splenomegaly, greater than 13 cm craniocaudal.  Small hiatal hernia.  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 There is no pancreatic ductal dilatation or dominant pancreatic mass. Moderate common duct dilatation, including at 1.6 cm on image 50/series 6. Mild to moderate intrahepatic ductal dilatation. No obstructive stone or mass identified.  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 Normal adrenal glands and kidneys.  Incompletely imaged are peritoneal implants, including within the gastrocolic ligament 3.2 cm  on image 61/series 16. No ascites.  IMPRESSION: 1. Biliary ductal dilatation without obstructive stone or mass identified. Cannot exclude ampullary stenosis or otherwise occult ampullary lesion. ERCP should be considered. 2. Ill-defined vague area of high right liver lobe heterogeneous enhancement and multi focal cystic change. Considerations include an atypical appearance of infiltrative hepatocellular carcinoma, peribiliary cysts  superimposed upon biliary ductal dilatation, or an atypical entity such as hepatic peliosis. 3. Incompletely imaged peritoneal masses. 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 Recommend further evaluation with PET to direct biopsy. This will also allow evaluation of the liver more entirely. 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   Electronically Signed   By: Abigail Miyamoto M.D.   On: 06/20/2014 10:50       Medications:    Infusions: . bivalirudin (ANGIOMAX) infusion 0.5 mg/mL (Non-ACS indications) 0.1 mg/kg/hr (06/19/14 2208)    Scheduled Medications: . famotidine  20 mg Oral BID  . furosemide  40 mg Oral Daily  . insulin aspart  0-15 Units Subcutaneous 6 times per day  . insulin glargine  10 Units Subcutaneous QHS  . lisinopril  20 mg Oral BID  . metoprolol tartrate  12.5 mg Oral BID  . sodium chloride  3 mL Intravenous Q12H  . sotalol  80 mg Oral BID    PRN Medications: HYDROmorphone (DILAUDID) injection, ondansetron (ZOFRAN) IV, ondansetron   Assessment/ Plan:    Principal Problem:   Abdominal pain, epigastric Active Problems:   Elevated lipase   Hx of ischemic bowel disease   Diabetes   Liver masses   Thrombosis of mesenteric artery   Splenic infarction   Portal vein thrombosis   Portal hypertension   HTN (hypertension), benign   Atrial fibrillation   Other pancytopenia  #Abdominal pain with new masses on CT Patient presented with epigastric abdominal pain, nausea, and vomiting with a history of acute pancreatitis in 5681 complicated by pseudocyst that had to be drained and previous cholecystectomy.  She also underwent exploratory laproscopy for ischemic colitis on 04/12/14.  Lipase >3000.  AST, ALT, and T bili elevated with mild elevation of Alk Phos. Lipid panel normal with no hypertriglyceridemia. CXR normal.  Abdominal US demonstrated CBD dilation up to 1.8 cm with intrahepatic biliary ductal dilation and a cystic lesion in R hepatic  lobe measuring 1.7 x 1.5 cm.  Originally abdominal pain thought to be consistent with gallstone pancreatitis.  GI (Dr. Benson Norway) was consulted and recommended ERCP tomorrow for sphincterotomy.  CT A/P showed no evidence of acute pancreatitis, but showed omental and mesenteric masses along with multiple ill-defined lesions in R hepatic lobe concerning for malignancy.  MRI liver showed heterogenous enhancement and multi-focal cystic change, which would be atypical for malignancy.  Reviewed images with radiology who thought the liver changes could be related to venous thrombosis.  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  This could explain her elevated liver enzymes and pancytopenia. ERCP attempted yesterday, but ampulla could not be accessed.  Less concern for obstruction now with improving liver enzymes and lipase back to normal, so Dr. Benson Norway recommends no further work-up currently.  He will perform colonoscopy to rule out colon adenocarcinoma with metastasis tomorrow.  Plan to discharge home after that and follow up labs in 2 weeks with repeat MRI in 6 weeks likely in [place]. -Colonoscopy tomorrow. -Clear liquid diet with bowel prep per Dr. Benson Norway. -Address anticoagulation as below. -Dilaudid 0.5 mg q3h PRN. -Follow up in [facility] in 2 weeks. -  Repeat MRI in 4-6 weeks.  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  Now with probable portal venous thrombosis as well on Eliquis, suggesting underlying hypercoagulability.  Hem/onc consulted. Work-up negative to this point with borderline increased anti-beta-2-glycoprotein and decreased protein S activity that does not explain her clinical picture.  NOACs not studied in this situation.  Dr. Beryle Beams recommends Lovenox 1.5 mg/kg/day until platelet function improved then warfarin in 1-2 weeks.  If HIT positive, could do Arixtra.  Will switch tomorrow after colonoscopy due to short half-life and  have PCP/cardiologist switch to warfarin in [place]. -Continue bivalirudin per pharmacy consult. -Switch to Lovenox or Arixtra tomorrow.  #Acute onset pancytopenia WBC (6.9>2.4), Hgb (12.2>8.8), Platelets (123>54) all decreased after receiving only heparin and Dilaudid last night.  Concern for HIT, but last heparin exposure >30 days ago.  4 Ts score = 4 intermediate probability.  Hem/onc consulted and raised concern for early DIC, but no schistocytes on blood smear.  D-dimer elevated at 4.13, but fibrinogen normal.  D-dimer improved on recheck with stable fibrinogen.  Now most likely due to acute liver failure.  In view of recent SMA thrombosis, hem/onc recommended anticoagulation until heparin antibody panel results back.  Currently on bivalirudin per pharmacy consult with plan to switch after colonoscopy. -No heparin until HIT panel back. -Bivalirudin per pharmacy consult with plan to switch (see above). -Follow CBC.  #Atrial fibrillation Possible cause of SMA thrombosis, but not causing hepatic thrombosis.  On Eliquis and sotalol at home.  In sinus rthythm today.  Will opt for warfarin eventually given new clots in liver instead of NOAC. -Stop home Eliquis 5 mg BID. Anticoagulate as above. -Continue sotalol 80 mg BID to maintain sinus rhythm. -On telemetry.  #DM2 On metformin 500 mg BID and Lantus 30 units QHS with Humalog correction at home. CBGs elevated after resuming eating, but will be eating less today with colonoscopy prep. -Holding metformin. -Increase Lantus from 10 to 15 units. -SSI moderate.  #HTN Restarted home meds yesterday except furosemide 40 mg daily.  Patient reports taking metoprolol 25 mg daily at home, but discharge from [facility] says BID.  Switched to 12.5 mg BID due to concern for low heart rate with 25 mg BID.  Tried to give amlodipine yesterday, but patient refused due to previous leg swelling.  BP elevated this morning, so will restart  furosemide. -Continue home lisinopril 20 mg BID, metoprolol 12.5 mg BID. -Restart furosemide 40 mg daily. -Consider clonipine or hydralazine if BP remains elevated.  Could also switch metop to carvedilol.  #HLD Lipid panel cholesterol 73, triglycerides 73, HDL 47, LDL 11. -Continue home Crestor 20 mg QHS.   DVT PPX - SCD's while in bed  CODE STATUS - Full code  CONSULTS PLACED - GI, Hem/onc  DISPO - Disposition is deferred at this time, awaiting colonoscopy.   Anticipated discharge in approximately 1 day(s).   The patient does have a current PCP Solmon Ice [phone]) and does need an [facility] hospital follow-up appointment after discharge.    Is the [facility] hospital follow-up appointment a one-time only appointment? Not applicable.  Does the patient have transportation limitations that hinder transportation to clinic appointments? no   SERVICE NEEDED AT [place]         Y = Yes, Blank = No PT:   OT:   RN:   Equipment:   Other:      Length of Stay: 3 day(s)  Signed: Arman Filter, MD  PGY-1, Internal Medicine Resident Pager: [phone] (7AM-5PM) 06/21/2014, 12:34 PM

## 2014-06-21 NOTE — Progress Notes (Signed)
Md notified of patient's concern, if ivf can be dc'd.. Clarification on diet with hx of DM and freq of CBG, okay to be on diabetic diet and CBG achs. MD notified of elevated bp, sbp in 180s, MD to restart home meds per MD. Sherrie Mustache 11:57 AM 06/21/2014

## 2014-06-21 NOTE — Progress Notes (Signed)
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 Page Dr. Hollice Espy to make him aware. Awaiting call back

## 2014-06-21 NOTE — Progress Notes (Signed)
ANTICOAGULATION CONSULT NOTE - Follow Up Consult  Pharmacy Consult for  Bivalirudin Indication: Questionable HIT; hx afib (off apixaban)  Allergies  Allergen Reactions  . Polysporin [Bacitracin-Polymyxin B] Other (See Comments)    unknown    Patient Measurements: Height: 5\' 1"  (154.9 cm) Weight: 140 lb 14 oz (63.9 kg) IBW/kg (Calculated) : 47.8  Vital Signs: Temp: 98.2 F (36.8 C) (08/14 0359) Temp src: Oral (08/14 0359) BP: 186/79 mmHg (08/14 0359) Pulse Rate: 52 (08/14 0359)  Labs:  Recent Labs  06/18/14 1333 06/18/14 1358 06/18/14 2147 06/19/14 0706  06/19/14 1500  06/20/14 0017 06/20/14 0831 06/21/14 0410  HGB 12.2 12.6  --  8.8*  --  9.9*  --  10.0*  --  10.5*  HCT 36.4 37.0  --  25.7*  --  29.2*  --  29.3*  --  31.9*  PLT 123*  --   --  54*  --  58*  --  62*  --  84*  APTT  --   --   --  >200*  < >  --   < > 63* 50* 62*  LABPROT  --   --  22.8*  --   --   --   --   --   --   --   INR  --   --  2.01*  --   --   --   --   --   --   --   HEPARINUNFRC  --   --  >2.20* >2.20*  --   --   --   --   --   --   CREATININE 0.85 1.00  --   --   --   --   --  0.68  --   --   < > = values in this interval not displayed.  Estimated Creatinine Clearance: 55.2 ml/min (by C-G formula based on Cr of 0.68).   Medications: Bivalrudin at 0.1 mg/kg/hr (6.39 mg/hr)  Assessment: 73 yo F who presented to the [facility] on 8/11 with abdominal pain and acute pancreatitis. The patient was previously on apixaban for anticoagulation in the setting of Afib - this was held on admission and transitioned to heparin for anticoagulation. While on heparin, the patient's platelets had a significant drop by 50% and hematology was consulted. It was recommended to switch the patient to argatroban while ruling out HIT. The patient was also noted to have elevated LFTs and was then transitioned to Bivalrudin.    Bivalrudin continues at 0.1mg /kg/hr the with therapeutic aPTT.  Goal of Therapy:  aPTT 50-85  seconds Monitor platelets by anticoagulation protocol: Yes   Plan:  Continue bivalrudin at 6.39 mg/hr  Daily aPTT Follow up HIT panel results  Noted Dr. Azucena Freed recommendations to transition to Lovenox if HIT panel negative.  [employer], Pharm.D., BCPS Clinical Pharmacist Pager [phone] 06/21/2014 9:25 AM

## 2014-06-21 NOTE — Progress Notes (Signed)
Inpatient Diabetes Program Recommendations  AACE/ADA: New Consensus Statement on Inpatient Glycemic Control (2013)  Target Ranges:  Prepandial:   less than 140 mg/dL      Peak postprandial:   less than 180 mg/dL (1-2 hours)      Critically ill patients:  140 - 180 mg/dL     Results for ASHIA, DEHNER (MRN [id number]) as of 06/21/2014 12:59  Ref. Range 06/21/2014 00:27 06/21/2014 04:09 06/21/2014 08:19 06/21/2014 11:10  Glucose-Capillary Latest Range: 70-99 mg/dL 313 (H) 160 (H) 192 (H) 325 (H)     Current insulin orders:  Lantus 10 units QHS Novolog Moderate SSI Q4 hours   **Patient having glucose elevations.  Now on PO diet.    MD- Please consider the following:  1. Change Novolog Moderate SSI to tid ac + HS (currently ordered Q4 hours) 2. Increase Lantus to 20 units QHS (2/3 home dose)     Will follow Wyn Quaker RN, MSN, CDE Diabetes Coordinator Inpatient Diabetes Program Team Pager: [phone] (8a-10p)

## 2014-06-22 ENCOUNTER — Encounter (HOSPITAL_COMMUNITY)
Admission: EM | Disposition: A | Discharge status: Home / Self Care | Payer: Self-pay | Source: Home / Self Care | Attending: Internal Medicine

## 2014-06-22 ENCOUNTER — Encounter (HOSPITAL_COMMUNITY): Payer: Self-pay

## 2014-06-22 DIAGNOSIS — I81 Portal vein thrombosis: Principal | ICD-10-CM

## 2014-06-22 DIAGNOSIS — K55059 Acute (reversible) ischemia of intestine, part and extent unspecified: Secondary | ICD-10-CM

## 2014-06-22 DIAGNOSIS — K859 Acute pancreatitis without necrosis or infection, unspecified: Secondary | ICD-10-CM

## 2014-06-22 DIAGNOSIS — Z8719 Personal history of other diseases of the digestive system: Secondary | ICD-10-CM

## 2014-06-22 HISTORY — PX: COLONOSCOPY: SHX5424

## 2014-06-22 LAB — PROTHROMBIN GENE MUTATION

## 2014-06-22 LAB — COMPREHENSIVE METABOLIC PANEL
ALBUMIN: 3.2 g/dL — AB (ref 3.5–5.2)
ALT: 285 U/L — ABNORMAL HIGH (ref 0–35)
AST: 75 U/L — ABNORMAL HIGH (ref 0–37)
Alkaline Phosphatase: 326 U/L — ABNORMAL HIGH (ref 39–117)
Anion gap: 14 (ref 5–15)
BUN: 9 mg/dL (ref 6–23)
CO2: 27 mEq/L (ref 19–32)
CREATININE: 0.57 mg/dL (ref 0.50–1.10)
Calcium: 8.5 mg/dL (ref 8.4–10.5)
Chloride: 104 mEq/L (ref 96–112)
GFR calc Af Amer: >90 mL/min (ref >90)
GFR calc non Af Amer: >90 mL/min (ref >90)
Glucose, Bld: 153 mg/dL — ABNORMAL HIGH (ref 70–99)
Potassium: 3.1 mEq/L — ABNORMAL LOW (ref 3.7–5.3)
SODIUM: 145 meq/L (ref 137–147)
TOTAL PROTEIN: 5.8 g/dL — AB (ref 6.0–8.3)
Total Bilirubin: 2.1 mg/dL — ABNORMAL HIGH (ref 0.3–1.2)

## 2014-06-22 LAB — GLUCOSE, CAPILLARY
GLUCOSE-CAPILLARY: 146 mg/dL — AB (ref 70–99)
GLUCOSE-CAPILLARY: 295 mg/dL — AB (ref 70–99)
Glucose-Capillary: 136 mg/dL — ABNORMAL HIGH (ref 70–99)
Glucose-Capillary: 411 mg/dL — ABNORMAL HIGH (ref 70–99)

## 2014-06-22 LAB — CBC
HCT: 30.8 % — ABNORMAL LOW (ref 36.0–46.0)
Hemoglobin: 10.3 g/dL — ABNORMAL LOW (ref 12.0–15.0)
MCH: 28.7 pg (ref 26.0–34.0)
MCHC: 33.4 g/dL (ref 30.0–36.0)
MCV: 85.8 fL (ref 78.0–100.0)
Platelets: 85 10*3/uL — ABNORMAL LOW (ref 150–400)
RBC: 3.59 MIL/uL — ABNORMAL LOW (ref 3.87–5.11)
RDW: 14.4 % (ref 11.5–15.5)
WBC: 3.3 10*3/uL — ABNORMAL LOW (ref 4.0–10.5)

## 2014-06-22 LAB — PROTIME-INR
INR: 1.56 — ABNORMAL HIGH (ref 0.00–1.49)
PROTHROMBIN TIME: 18.7 s — AB (ref 11.6–15.2)

## 2014-06-22 LAB — GLUCOSE, RANDOM: GLUCOSE: 446 mg/dL — AB (ref 70–99)

## 2014-06-22 LAB — FACTOR 5 LEIDEN

## 2014-06-22 LAB — APTT: aPTT: 60 seconds — ABNORMAL HIGH (ref 24–37)

## 2014-06-22 SURGERY — COLONOSCOPY
Anesthesia: Moderate Sedation

## 2014-06-22 MED ORDER — FENTANYL CITRATE 0.05 MG/ML IJ SOLN
INTRAMUSCULAR | Status: DC | PRN
  Administered 2014-06-22 (×3): 25 ug via INTRAVENOUS

## 2014-06-22 MED ORDER — INSULIN GLARGINE 100 UNIT/ML ~~LOC~~ SOLN
25.0000 [IU] | Freq: Every day | SUBCUTANEOUS | Status: DC
  Administered 2014-06-22 – 2014-06-23 (×2): 25 [IU] via SUBCUTANEOUS
  Filled 2014-06-22 (×3): qty 0.25

## 2014-06-22 MED ORDER — MIDAZOLAM HCL 5 MG/ML IJ SOLN
INTRAMUSCULAR | Status: AC
  Filled 2014-06-22: qty 2

## 2014-06-22 MED ORDER — INSULIN ASPART 100 UNIT/ML ~~LOC~~ SOLN
12.0000 [IU] | Freq: Once | SUBCUTANEOUS | Status: AC
  Administered 2014-06-22: 12 [IU] via SUBCUTANEOUS

## 2014-06-22 MED ORDER — METOPROLOL TARTRATE 25 MG PO TABS
25.0000 mg | ORAL_TABLET | Freq: Two times a day (BID) | ORAL | Status: DC
  Administered 2014-06-22: 25 mg via ORAL
  Filled 2014-06-22 (×3): qty 1

## 2014-06-22 MED ORDER — POTASSIUM CHLORIDE CRYS ER 20 MEQ PO TBCR
40.0000 meq | EXTENDED_RELEASE_TABLET | Freq: Two times a day (BID) | ORAL | Status: AC
  Administered 2014-06-22 – 2014-06-23 (×3): 40 meq via ORAL
  Filled 2014-06-22 (×3): qty 2

## 2014-06-22 MED ORDER — MIDAZOLAM HCL 5 MG/5ML IJ SOLN
INTRAMUSCULAR | Status: DC | PRN
  Administered 2014-06-22 (×4): 2 mg via INTRAVENOUS

## 2014-06-22 MED ORDER — FENTANYL CITRATE 0.05 MG/ML IJ SOLN
INTRAMUSCULAR | Status: AC
  Filled 2014-06-22: qty 2

## 2014-06-22 NOTE — Progress Notes (Signed)
Spoke with Dr. Alice Rieger about elevated cbg in person, MD at bedside, to place order. Sherrie Mustache 5:50 PM 06/22/2014

## 2014-06-22 NOTE — Progress Notes (Signed)
PATIETN CONVERTED TO S.B. 50'S.

## 2014-06-22 NOTE — Progress Notes (Signed)
Internal Medicine Attending  Date: 06/22/2014  Patient name: Wendy Meza Medical record number: [id number] Date of birth: 27-Feb-1941 Age: 73 y.o. Gender: female  I saw and evaluated the patient, and discussed her care with resident Dr. Alice Rieger; see his note for details of clinical findings and plans.  Colonoscopy today was normal.  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  Plan is continue bivalirudin pending the SRA result.  Agree with increase in metoprolol given periods of increased heart rate today.  We discussed the pending lab and plans at length with patient and her family today.

## 2014-06-22 NOTE — Progress Notes (Addendum)
Subjective: She feels better today. Results from HIT panel reveals positive for antiplatelet antibodies, but awaiting confirmatory testing. She had a colonoscopy today, which was normal Tachycardia 120s 140s overnight. Asymptomatic. Objective: Vital signs in last 24 hours: Filed Vitals:   06/22/14 1140 06/22/14 1145 06/22/14 1306 06/22/14 1443  BP: 134/33 135/34 142/72 137/83  Pulse: 52 54 144 71  Temp:    97.5 F (36.4 C)  TempSrc:    Oral  Resp: 16 16  18   Height:      Weight:      SpO2: 100% 100%  100%   Weight change:   Intake/Output Summary (Last 24 hours) at 06/22/14 1815 Last data filed at 06/22/14 1727  Gross per 24 hour  Intake  638.6 ml  Output   1450 ml  Net -811.4 ml    General: Vital signs reviewed. No distress. Husband is in the room Lungs: Clear to auscultation bilaterally  Heart: RRR; no extra sounds or murmurs  Abdomen: Bowel sounds present, soft, nontender; no hepatosplenomegaly  Extremities: No bilateral ankle edema  Neurologic: Alert and oriented x3. Moves all extremities  Lab Results: Basic Metabolic Panel:  Recent Labs Lab 06/21/14 0844 06/22/14 0356 06/22/14 1705  NA 140 145  --   K 4.0 3.1*  --   CL 107 104  --   CO2 22 27  --   GLUCOSE 225* 153* 446*  BUN 11 9  --   CREATININE 0.62 0.57  --   CALCIUM 8.2* 8.5  --    Liver Function Tests:  Recent Labs Lab 06/21/14 0844 06/22/14 0356  AST 126* 75*  ALT 355* 285*  ALKPHOS 268* 326*  BILITOT 2.7* 2.1*  PROT 5.6* 5.8*  ALBUMIN 2.9* 3.2*    Recent Labs Lab 06/18/14 1333 06/20/14 1132  LIPASE >3000* 85*   CBC:  Recent Labs Lab 06/19/14 1500 06/20/14 0017 06/21/14 0410 06/22/14 0356  WBC 2.7* 2.7* 3.3* 3.3*  NEUTROABS 1.7 1.7  --   --   HGB 9.9* 10.0* 10.5* 10.3*  HCT 29.2* 29.3* 31.9* 30.8*  MCV 86.6 85.9 87.9 85.8  PLT 58* 62* 84* 85*   D-Dimer:  Recent Labs Lab 06/19/14 1104 06/20/14 1132  DDIMER 4.13* 2.49*   CBG:  Recent Labs Lab 06/21/14 1110  06/21/14 1609 06/21/14 2116 06/22/14 0546 06/22/14 1217 06/22/14 1621  GLUCAP 325* 238* 229* 146* 136* 411*   Fasting Lipid Panel:  Recent Labs Lab 06/19/14 0815  CHOL 73  HDL 47  LDLCALC 11  TRIG 73  CHOLHDL 1.6   Thyroid Function Tests: No results found for this basename: TSH, T4TOTAL, FREET4, T3FREE, THYROIDAB,  in the last 168 hours Coagulation:  Recent Labs Lab 06/18/14 2147 06/22/14 0356  LABPROT 22.8* 18.7*  INR 2.01* 1.56*   Anemia Panel: No results found for this basename: VITAMINB12, FOLATE, FERRITIN, TIBC, IRON, RETICCTPCT,  in the last 168 hours Urine Drug Screen: Drugs of Abuse  No results found for this basename: labopia, cocainscrnur, labbenz, amphetmu, thcu, labbarb    Alcohol Level: No results found for this basename: ETH,  in the last 168 hours Urinalysis:  Recent Labs Lab 06/18/14 1918  COLORURINE YELLOW  LABSPEC 1.009  PHURINE 6.0  GLUCOSEU 100*  HGBUR LARGE*  [place] 30*  UROBILINOGEN 1.0  NITRITE NEGATIVE  LEUKOCYTESUR NEGATIVE   Micro Results: No results found for this or any previous visit (from the past 240 hour(s)). Studies/Results: No results found. Medications: I have  reviewed the patient's current medications. Scheduled Meds: . famotidine  20 mg Oral BID  . furosemide  40 mg Oral Daily  . insulin aspart  0-15 Units Subcutaneous TID AC & HS  . insulin glargine  15 Units Subcutaneous QHS  . lisinopril  20 mg Oral BID  . metoprolol tartrate  25 mg Oral BID  . potassium chloride  40 mEq Oral BID  . sodium chloride  3 mL Intravenous Q12H  . sotalol  80 mg Oral BID   Continuous Infusions: . bivalirudin (ANGIOMAX) infusion 0.5 mg/mL (Non-ACS indications) 0.1 mg/kg/hr (06/21/14 1935)   PRN Meds:.morphine injection, ondansetron (ZOFRAN) IV, ondansetron Assessment/Plan: Principal Problem:   Abdominal pain, epigastric Active Problems:   Elevated lipase   Hx of ischemic bowel  disease   Diabetes   Liver masses   Thrombosis of mesenteric artery   Splenic infarction   Portal vein thrombosis   Portal hypertension   HTN (hypertension), benign   Atrial fibrillation   Other pancytopenia  Abdominal pain with new masses on CT: Symptoms resolved. Presented with abdominal pain, and elevated lipase concerning for acute pancreatitis. Lipase was more than 3000 and this has decreased to 85. She has a history of acute pancreatitis in 3976 complicated by pseudocyst that had to be drained and previous cholecystectomy. She also underwent exploratory laproscopy for ischemic colitis on 04/12/14. AST, ALT, and T bili elevated with mild elevation of Alk Phos. Lipid panel normal with no hypertriglyceridemia. CXR normal. Abdominal US demonstrated CBD dilation up to 1.8 cm with intrahepatic biliary ductal dilation and a cystic lesion in R hepatic lobe measuring 1.7 x 1.5 cm. Originally abdominal pain thought to be consistent with gallstone pancreatitis. GI (Dr. Benson Norway) was consulted and recommended ERCP was attempted on 06/21/2014 but this was unsuccessful and due to complex anatomy. CT A/P showed no evidence of acute pancreatitis, but showed omental and mesenteric masses along with multiple ill-defined lesions in R hepatic lobe concerning for malignancy. MRI liver showed heterogenous enhancement and multi-focal cystic change, which would be atypical for malignancy. Reviewed images with radiology who thought the liver changes could be related to venous thrombosis. 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 This could explain her elevated liver enzymes and pancytopenia. Less concern for obstruction now with improving liver enzymes and lipase back to normal, so Dr. Benson Norway recommends no further work-up currently. Colonoscopy performed on 06/22/2014 was normal. Plan to discharge home after that and follow up labs in 2 weeks with repeat MRI in 6 weeks likely in [place].  -Carb  modified -Main question is anticoagulation as detailed below.  -Follow up in [facility] in 2 weeks.  -Repeat MRI in 4-6 weeks.   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 Now with probable portal venous thrombosis as well on Eliquis, suggesting underlying hypercoagulability. Hem/onc consulted. Work-up negative to this point with borderline increased anti-beta-2-glycoprotein and decreased protein S activity that does not explain her clinical picture. NOACs not studied in this situation. Dr. Beryle Beams recommends Lovenox 1.5 mg/kg/day until platelet function improved then warfarin in 1-2 weeks. He had also recommended that If HIT positive, could do Arixtra but cost is likely to be prohibitive. Further consideration for appropriate anticoagulation will need discussion with hematologist.  -Continue bivalirudin per pharmacy consult.  - Awaiting confirmatory testing for HIT panel. Unfortunately, test cannot be performed today, as it's outsourced in [place]. Likely results will be available tomorrow. At that  point, will decide on appropriate anticoagulation   #Acute onset pancytopenia  WBC (6.9>2.4), Hgb (12.2>8.8), Platelets (123>54) all decreased after receiving only heparin. Concern for HIT, but last heparin exposure >30 days ago. 4 Ts score = 4 intermediate probability. Hem/onc consulted and raised concern for early DIC, but no schistocytes on blood smear. D-dimer elevated at 4.13, but fibrinogen normal. D-dimer improved on recheck with stable fibrinogen. Now most likely due to acute liver failure.  Plan  - will avoid any Heparin products due to HIT - Currently on bivalirudin per pharmacy  - No heparin until HIT panel back.  - Bivalirudin per pharmacy consult with plan to switch (see above).  - Follow CBC.   Atrial fibrillation  Possible cause of SMA thrombosis, but not causing hepatic thrombosis. On  Eliquis and sotalol at home. Had a few runs of HR 120-130. May opt for warfarin eventually given new clots in liver instead of NOAC.  -Stopped home Eliquis 5 mg BID. Anticoagulate as above.  -Continue sotalol 80 mg BID to maintain sinus rhythm - Increase metoprolol from 12.5 mg twice a day to 25 mg twice a day..  -On telemetry.  - supplement K by K-dur   DM2  On metformin 500 mg BID and Lantus 30 units QHS with Humalog correction at home. CBGs elevated after resuming eating, but will be eating less today with colonoscopy prep.  -Holding metformin.  - CBGs high this afternoon. Gave insulin 12 units  -Blood sugars continue to remain elevated. Will increase Lantus to home dose of 25 units -SSI moderate.   HTN: Restarted home meds yesterday except furosemide 40 mg daily. Patient reports taking metoprolol 25 mg daily at home, but discharge from [facility] says BID. Switched to 12.5 mg BID due to concern for low heart rate with 25 mg BID. Tried to give amlodipine yesterday, but patient refused due to previous leg swelling. BP elevated this morning, so will restart furosemide.  -Continue home lisinopril 20 mg BID, increase metoprolol 12.5 mg BID to 25 mg bid  -Restart furosemide 40 mg daily.   #HLD  Lipid panel cholesterol 73, triglycerides 73, HDL 47, LDL 11.  -Continue home Crestor 20 mg QHS.  DVT PPX - SCD's while in bed  CODE STATUS - Full code CONSULTS PLACED - GI, Hem/onc DISPO - Disposition is deferred at this time pending results from HIT panel and anticoagulation plan Anticipated discharge in approximately 1 day(s).  The patient does have a current PCP Solmon Ice [phone]) and does need an [facility] hospital follow-up appointment after discharge.  Is the [facility] hospital follow-up appointment a one-time only appointment? Not applicable.  Does the patient have transportation limitations that hinder transportation to clinic appointments? no SERVICE NEEDED AT [place] Y = Yes, Blank = No  PT:    OT:    RN:    Equipment:    Other:           LOS: 4 days   Jessee Avers PGY 3 - Internal Medicine Teaching Service Pager: [phone] 06/22/2014, 6:15 PM

## 2014-06-22 NOTE — Progress Notes (Signed)
Per MD Olevia Perches, okay to give pt tap water enema 500cc x2 for bowel prep for colonoscopy. Sherrie Mustache 9:01 AM 06/22/2014

## 2014-06-22 NOTE — Progress Notes (Signed)
MD paged for pt and family request for additional bowel prep, a/w returned phone call. Sherrie Mustache 8:43 AM 06/22/2014

## 2014-06-22 NOTE — Progress Notes (Signed)
Dr. Gareth Morgan call and made aware of tonight events. And that meds were given at 20:45 for high H.R... And B.P. H.r. REMAINS IN THE 120-140'S AT. FIB.  SEE ORDERS WRITTEN FOR LOPRESSOR 5 MG I.V.

## 2014-06-22 NOTE — Progress Notes (Signed)
ANTICOAGULATION CONSULT NOTE - Follow Up Consult  Pharmacy Consult for  Bivalirudin Indication: Questionable HIT; hx afib (off apixaban)  Allergies  Allergen Reactions  . Polysporin [Bacitracin-Polymyxin B] Other (See Comments)    unknown    Patient Measurements: Height: 5\' 1"  (154.9 cm) Weight: 140 lb 14 oz (63.9 kg) IBW/kg (Calculated) : 47.8  Vital Signs: Temp: 98.4 F (36.9 C) (08/15 0500) Temp src: Oral (08/15 0500) BP: 142/72 mmHg (08/15 1306) Pulse Rate: 144 (08/15 1306)  Labs:  Recent Labs  06/20/14 0017 06/20/14 0831 06/21/14 0410 06/21/14 0844 06/22/14 0356  HGB 10.0*  --  10.5*  --  10.3*  HCT 29.3*  --  31.9*  --  30.8*  PLT 62*  --  84*  --  85*  APTT 63* 50* 62*  --  60*  LABPROT  --   --   --   --  18.7*  INR  --   --   --   --  1.56*  CREATININE 0.68  --   --  0.62 0.57    Estimated Creatinine Clearance: 55.2 ml/min (by C-G formula based on Cr of 0.57).   Medications: Bivalrudin at 0.1 mg/kg/hr (6.39 mg/hr)  Assessment: 73 yo F who presented to the [facility] on 8/11 with abdominal pain and acute pancreatitis. The patient was previously on apixaban for anticoagulation in the setting of Afib - this was held on admission and transitioned to heparin for anticoagulation. While on heparin, the patient's platelets had a significant drop by 50% and hematology was consulted. It was recommended to switch the patient to argatroban while ruling out HIT. The patient was also noted to have elevated LFTs and was then transitioned to Bivalrudin.    Bivalrudin continues at 0.1mg /kg/hr the with therapeutic aPTT.  Per lab, pt is HIT +.  I have placed a preliminary paper copy of these results in the patient's paper chart.  Final result will not be available electronically until 8/17.  Goal of Therapy:  aPTT 50-85 seconds Monitor platelets by anticoagulation protocol: Yes   Plan:  Continue bivalrudin at 6.39 mg/hr  Daily aPTT Follow up HIT panel results  In  anticipation of transition to Arixtra, would recommend Arixtra 7.5mg  SQ once daily with the first dose to be given 1 hour after the discontinuation of Bivalrudin.   [employer], Pharm.D., BCPS Clinical Pharmacist Pager [phone] 06/22/2014 2:37 PM

## 2014-06-22 NOTE — Progress Notes (Addendum)
Patient anxious having diff. Drinking  Go-lytle . Zofran giving with no relief.H.R. Now at. Fib. And rate 120-140 Bp up 197/ 68 R.N. Aware and gave 10 p.m. meds now for high heart rate and B .P. E.K.G. Done to confirm rhythm. Dr. Cristal Generous called and made aware of the above. See orders to stop Go-lytle and give one bottle of mag. Citrate at 23:00

## 2014-06-22 NOTE — Op Note (Signed)
[facility] [place], [id number]   COLONOSCOPY PROCEDURE REPORT  PATIENT: Wendy Meza, Wendy Meza  MR#: [id number] BIRTHDATE: 1941/01/25 , 72  yrs. old GENDER: Female ENDOSCOPIST: Lafayette Dragon, MD REFERRED HW:[id number] Benson Norway, M.D. PROCEDURE DATE:  06/22/2014 PROCEDURE:   Colonoscopy, diagnostic First Screening Colonoscopy - Avg.  risk and is 50 yrs.  old or older - No.  Prior Negative Screening - Now for repeat screening. N/A  History of Adenoma - Now for follow-up colonoscopy & has been > or = to 3 yrs.  N/A  Polyps Removed Today? No.  Recommend repeat exam, <10 yrs? No. ASA CLASS:   Class III INDICATIONS: mesenteric and splenic vein thromboses as well as portal vein thrombosis. , pancreatitis, Abdominal pain. CT scan shows thickening of the transverse colon  r/o GI malignancy, MEDICATIONS: These medications were titrated to patient response per physician's verbal order, Versed 8 mg IV, and Fentanyl 75 mcg IV  DESCRIPTION OF PROCEDURE:   After the risks benefits and alternatives of the procedure were thoroughly explained, informed consent was obtained.  A digital rectal exam revealed no abnormalities of the rectum.   The Pentax Ped Colon [id number] endoscope was introduced through the anus and advanced to the cecum, which was identified by both the appendix and ileocecal valve. No adverse events experienced.   The quality of the prep was good, using N[place]tley  The instrument was then slowly withdrawn as the colon was fully examined.      COLON FINDINGS: A normal appearing cecum, ileocecal valve, and appendiceal orifice were identified.  The ascending, hepatic flexure, transverse, splenic flexure, descending, sigmoid colon and rectum appeared unremarkable.  No polyps or cancers were seen. Retroflexed views revealed no abnormalities. The time to cecum=5 minutes 24 seconds.  Withdrawal time=7 minutes 30 seconds.  The scope was withdrawn and the  procedure completed. COMPLICATIONS: There were no complications.  ENDOSCOPIC IMPRESSION: Normal colon , nothing to explain CT scan abnormality involving transverse colon( no evidence of ischemic colitis)  RECOMMENDATIONS: advance diet continue Angiomax further disposition as per Dr Benson Norway who will be back on 06/22/2014   eSigned:  Lafayette Dragon, MD 06/22/2014 11:45 AM   cc:

## 2014-06-23 DIAGNOSIS — I4891 Unspecified atrial fibrillation: Secondary | ICD-10-CM

## 2014-06-23 DIAGNOSIS — I498 Other specified cardiac arrhythmias: Secondary | ICD-10-CM

## 2014-06-23 LAB — HEPATIC FUNCTION PANEL
ALBUMIN: 3 g/dL — AB (ref 3.5–5.2)
ALT: 183 U/L — ABNORMAL HIGH (ref 0–35)
AST: 41 U/L — ABNORMAL HIGH (ref 0–37)
Alkaline Phosphatase: 288 U/L — ABNORMAL HIGH (ref 39–117)
BILIRUBIN DIRECT: 0.4 mg/dL — AB (ref 0.0–0.3)
BILIRUBIN INDIRECT: 0.9 mg/dL (ref 0.3–0.9)
BILIRUBIN TOTAL: 1.3 mg/dL — AB (ref 0.3–1.2)
Total Protein: 5.6 g/dL — ABNORMAL LOW (ref 6.0–8.3)

## 2014-06-23 LAB — CBC
HCT: 31.1 % — ABNORMAL LOW (ref 36.0–46.0)
Hemoglobin: 10.5 g/dL — ABNORMAL LOW (ref 12.0–15.0)
MCH: 29.6 pg (ref 26.0–34.0)
MCHC: 33.8 g/dL (ref 30.0–36.0)
MCV: 87.6 fL (ref 78.0–100.0)
Platelets: 93 10*3/uL — ABNORMAL LOW (ref 150–400)
RBC: 3.55 MIL/uL — ABNORMAL LOW (ref 3.87–5.11)
RDW: 14.5 % (ref 11.5–15.5)
WBC: 2.5 10*3/uL — AB (ref 4.0–10.5)

## 2014-06-23 LAB — BASIC METABOLIC PANEL
ANION GAP: 10 (ref 5–15)
BUN: 13 mg/dL (ref 6–23)
CO2: 26 mEq/L (ref 19–32)
Calcium: 8.7 mg/dL (ref 8.4–10.5)
Chloride: 104 mEq/L (ref 96–112)
Creatinine, Ser: 0.64 mg/dL (ref 0.50–1.10)
GFR calc Af Amer: >90 mL/min (ref >90)
GFR, EST NON AFRICAN AMERICAN: 88 mL/min — AB (ref >90)
Glucose, Bld: 130 mg/dL — ABNORMAL HIGH (ref 70–99)
POTASSIUM: 3.7 meq/L (ref 3.7–5.3)
SODIUM: 140 meq/L (ref 137–147)

## 2014-06-23 LAB — GLUCOSE, CAPILLARY
GLUCOSE-CAPILLARY: 147 mg/dL — AB (ref 70–99)
GLUCOSE-CAPILLARY: 272 mg/dL — AB (ref 70–99)
Glucose-Capillary: 131 mg/dL — ABNORMAL HIGH (ref 70–99)
Glucose-Capillary: 435 mg/dL — ABNORMAL HIGH (ref 70–99)
Glucose-Capillary: 246 mg/dL — ABNORMAL HIGH (ref 70–99)

## 2014-06-23 LAB — APTT: aPTT: 55 seconds — ABNORMAL HIGH (ref 24–37)

## 2014-06-23 MED ORDER — METOPROLOL TARTRATE 12.5 MG HALF TABLET
12.5000 mg | ORAL_TABLET | Freq: Two times a day (BID) | ORAL | Status: DC
  Administered 2014-06-23 – 2014-06-24 (×2): 12.5 mg via ORAL
  Filled 2014-06-23 (×3): qty 1

## 2014-06-23 NOTE — Progress Notes (Addendum)
Subjective: She feels well today. No overnight events.  HR 50-69 beats per min She is tolerating diet okay  Objective: Vital signs in last 24 hours: Filed Vitals:   06/22/14 1443 06/22/14 2118 06/22/14 2224 06/23/14 0518  BP: 137/83 147/59  149/54  Pulse: 71 59 65 50  Temp: 97.5 F (36.4 C) 97.8 F (36.6 C)  98 F (36.7 C)  TempSrc: Oral Oral  Oral  Resp: 18 18  18   Height:      Weight:      SpO2: 100% 100%  99%   Weight change:   Intake/Output Summary (Last 24 hours) at 06/23/14 0917 Last data filed at 06/23/14 0700  Gross per 24 hour  Intake 778.44 ml  Output    525 ml  Net 253.44 ml    General: Vital signs reviewed. No distress. Family in the room Lungs: Clear to auscultation bilaterally  Heart: RRR; no extra sounds or murmurs  Abdomen: Bowel sounds present, soft, nontender; no hepatosplenomegaly  Extremities: No bilateral ankle edema  Neurologic: Alert and oriented x3. Moves all extremities  Lab Results: Basic Metabolic Panel:  Recent Labs Lab 06/22/14 0356 06/22/14 1705 06/23/14 0340  NA 145  --  140  K 3.1*  --  3.7  CL 104  --  104  CO2 27  --  26  GLUCOSE 153* 446* 130*  BUN 9  --  13  CREATININE 0.57  --  0.64  CALCIUM 8.5  --  8.7   Liver Function Tests:  Recent Labs Lab 06/21/14 0844 06/22/14 0356  AST 126* 75*  ALT 355* 285*  ALKPHOS 268* 326*  BILITOT 2.7* 2.1*  PROT 5.6* 5.8*  ALBUMIN 2.9* 3.2*    Recent Labs Lab 06/18/14 1333 06/20/14 1132  LIPASE >3000* 85*   CBC:  Recent Labs Lab 06/19/14 1500 06/20/14 0017  06/22/14 0356 06/23/14 0340  WBC 2.7* 2.7*  < > 3.3* 2.5*  NEUTROABS 1.7 1.7  --   --   --   HGB 9.9* 10.0*  < > 10.3* 10.5*  HCT 29.2* 29.3*  < > 30.8* 31.1*  MCV 86.6 85.9  < > 85.8 87.6  PLT 58* 62*  < > 85* 93*  < > = values in this interval not displayed. D-Dimer:  Recent Labs Lab 06/19/14 1104 06/20/14 1132  DDIMER 4.13* 2.49*   CBG:  Recent Labs Lab 06/22/14 0546 06/22/14 1217  06/22/14 1621 06/22/14 2116 06/23/14 0211 06/23/14 0632  GLUCAP 146* 136* 411* 295* 147* 131*   Fasting Lipid Panel:  Recent Labs Lab 06/19/14 0815  CHOL 73  HDL 47  LDLCALC 11  TRIG 73  CHOLHDL 1.6   Thyroid Function Tests: No results found for this basename: TSH, T4TOTAL, FREET4, T3FREE, THYROIDAB,  in the last 168 hours Coagulation:  Recent Labs Lab 06/18/14 2147 06/22/14 0356  LABPROT 22.8* 18.7*  INR 2.01* 1.56*   Anemia Panel: No results found for this basename: VITAMINB12, FOLATE, FERRITIN, TIBC, IRON, RETICCTPCT,  in the last 168 hours Urine Drug Screen: Drugs of Abuse  No results found for this basename: labopia,  cocainscrnur,  labbenz,  amphetmu,  thcu,  labbarb    Alcohol Level: No results found for this basename: ETH,  in the last 168 hours Urinalysis:  Recent Labs Lab 06/18/14 1918  COLORURINE YELLOW  LABSPEC 1.009  PHURINE 6.0  GLUCOSEU 100*  HGBUR LARGE*  BILIRUBINUR NEGATIVE  KETONESUR NEGATIVE  PROTEINUR 30*  UROBILINOGEN 1.0  NITRITE NEGATIVE  LEUKOCYTESUR  NEGATIVE   Micro Results: No results found for this or any previous visit (from the past 240 hour(s)). Studies/Results: No results found. Medications: I have reviewed the patient's current medications. Scheduled Meds: . famotidine  20 mg Oral BID  . furosemide  40 mg Oral Daily  . insulin aspart  0-15 Units Subcutaneous TID AC & HS  . insulin glargine  25 Units Subcutaneous QHS  . lisinopril  20 mg Oral BID  . metoprolol tartrate  25 mg Oral BID  . potassium chloride  40 mEq Oral BID  . sodium chloride  3 mL Intravenous Q12H  . sotalol  80 mg Oral BID   Continuous Infusions: . bivalirudin (ANGIOMAX) infusion 0.5 mg/mL (Non-ACS indications) 0.1 mg/kg/hr (06/21/14 1935)   PRN Meds:.morphine injection, ondansetron (ZOFRAN) IV, ondansetron Assessment/Plan: Principal Problem:   Abdominal pain, epigastric Active Problems:   Elevated lipase   Hx of ischemic bowel disease    Diabetes   Liver masses   Thrombosis of mesenteric artery   Splenic infarction   Portal vein thrombosis   Portal hypertension   HTN (hypertension), benign   Atrial fibrillation   Other pancytopenia  Abdominal masses ?etiology: infectious versus thromboembolic versus malignancy. She presented with abdominal pain, and elevated lipase concerning for acute pancreatitis. Symptoms have resolved. Lipase was more than 3000 and trended down to 85 after three days. She has a history of acute pancreatitis in 1610 complicated by pseudocyst that had to be drained and previous cholecystectomy. She also underwent exploratory laproscopy for ischemic colitis on 04/12/14. AST, ALT, and T bili elevated with mild elevation of Alk Phos. Lipid panel normal with no hypertriglyceridemia.  Abdominal US demonstrated CBD dilation up to 1.8 cm with intrahepatic biliary ductal dilation and a cystic lesion in R hepatic lobe measuring 1.7 x 1.5 cm. Originally abdominal pain thought to be consistent with gallstone pancreatitis. GI (Dr. Benson Norway) was consulted and ERCP was attempted on 06/21/2014 but this was unsuccessful due to complex anatomy. CT A/P showed no evidence of acute pancreatitis, but showed omental and mesenteric masses along with multiple ill-defined lesions in R hepatic lobe concerning for malignancy, thromboembolic event or even an infectious etiology were considered. MRI liver showed heterogenous enhancement and multi-focal cystic change, which would be atypical for malignancy. Reviewed images with radiology who thought the liver changes could be related to venous thrombosis. 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 This could explain her elevated liver enzymes and pancytopenia and possibly explaining, pancreatitis, as a result of arterial or venous thromboembolism of the pancreatic vasculature. Less concern for biliary obstruction with improving liver enzymes and lipase back to normal, so Dr. Benson Norway  recommends no further work-up currently. However, transient biliary tract obstruction with a bile stone could still be possible. The due to a concern of malignancy, a colonoscopy was performed on 06/22/2014 to evaluate for a colonic lesion or large bowel ischemia but this was normal. After discussion with hematology/oncologist, interventional radiologist and the primary team it was decided that tissue biopsy off any of the lesions that we have seen on abdominal imaging to be deferred at this time with plan to discharge the patient home with further follow up as outpatient. This follow up with include labs (CBC, CMP) in 2 weeks with repeat MRI in 6 weeks likely in [place].  Plan -Carb modified -Main question is anticoagulation as detailed below.  -Follow up in [facility] in 2 weeks.  -Repeat MRI in 4-6 weeks.   Hypercoagulability: Recent SMA thrombosis  attributed to atrial fibrillation, which is possible. Now with probable portal venous thrombosis as well on while on Eliquis, suggesting underlying hypercoagulability. Hem/onc was consulted. Work-up negative to this point with borderline increased anti-beta-2-glycoprotein and decreased protein S activity that does not explain her clinical picture. NOACs not studied in this situation. Dr. Beryle Beams recommends Lovenox 1.5 mg/kg/day until platelet function improved then warfarin in 1-2 weeks. He had also recommended that If HIT positive, could do Arixtra but cost is likely to be prohibitive. Further consideration for appropriate anticoagulation will need discussion with hematologist.  -Continue bivalirudin per pharmacy consult.  - Awaiting confirmatory testing for HIT panel. Unfortunately, test cannot be performed today, as it's outsourced in [place]. Likely results will be available tomorrow. 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 WBC (6.9>2.4), Hgb (12.2>8.8), Platelets (123>54) all decreased  after receiving only heparin overnight. There remains a concern for HIT, even though her last heparin exposure had been more than 30 days ago. Heparin was discontinued immediately after a repeat CBC. 4 Ts score = 4 intermediate probability. Hem/onc consulted and raised concern for early DIC, but no schistocytes on blood smear. D-dimer elevated at 4.13, but fibrinogen normal. D-dimer improved on recheck with stable fibrinogen. Now most likely due to acute liver failure. 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 Plan  - platelets improving to 93, White blood cell count still low at 2.5, hemoglobin stable at around 10.5. - will avoid any Heparin products due to HIT - Currently on bivalirudin per pharmacy  - No heparin until full HIT panel is back.  - Bivalirudin per pharmacy consult with plan to switch (see above).  - Follow CBC.  - Await confirmatory test for HIT before deciding on an appropriate anticoagulation as outpatient. Options include Lovenox versus Fondaparinux depending on HIT panel. She will then likely be transitioned to Coumadin as outpatient on return to [place] for close monitoring of INR.   Atrial fibrillation  Possible cause of SMA thrombosis, but not causing hepatic thrombosis. On Eliquis and sotalol at home. Had a few runs of HR 120-130. May opt for warfarin eventually given new clots in liver instead of NOAC. Increased Metoprolol from 12.5 mg bid to 25 mg bid yesterday. Telemetry with some a.fib and sinus bradycardia (44 lowest HR). She remains asymptomatic.  Plan  - Consult cardiology for advice on afib with bradycardia - Stopped home Eliquis 5 mg BID. Anticoagulate as above.  - Continue sotalol 80 mg BID to maintain sinus rhythm - continue with the metoprolol 25 mg twice a day.. - On telemetry.  - supplement K by K-dur as needed  DM2  On metformin 500 mg BID and Lantus 30 units QHS with  Humalog correction at home. CBGs elevated after resuming eating, but will be eating less today with colonoscopy prep.  -Holding metformin.  - CBGs - Lantus to home dose of 25 units -SSI moderate.   HTN: BP stable, that sometimes riding high. - Continue home lisinopril 20 mg BID, - metoprolol 25 mg bid  - Restart furosemide 40 mg daily.   #HLD  Lipid panel cholesterol 73, triglycerides 73, HDL 47, LDL 11.  -Continue home Crestor 20 mg QHS.  DVT PPX - SCD's while in bed  CODE STATUS - Full code CONSULTS PLACED - GI, Hem/onc DISPO - Disposition is deferred at this time pending results from HIT panel  and anticoagulation plan. Likely discharge today, late afternoon or tomorrow.  The patient does have a current PCP Solmon Ice [phone]) and does need an [facility] hospital follow-up appointment after discharge.  Is the [facility] hospital follow-up appointment a one-time only appointment? Not applicable.  Does the patient have transportation limitations that hinder transportation to clinic appointments? no SERVICE NEEDED AT [place] [facility] = Yes, Blank = No  PT:    OT:    RN:    Equipment:    Other:       LOS: 5 days   Jessee Avers PGY 3 - Internal Medicine Teaching Service Pager: [phone] 06/23/2014, 9:17 AM

## 2014-06-23 NOTE — Progress Notes (Signed)
Cardiac rhythm - atrial fib with rate in 120's. Pt states that she can feel the increased rate, but denies sob or lightheadedness. She is resting in bed. PM betablockers given. Will monitor closely.

## 2014-06-23 NOTE — Consult Note (Signed)
Reason for Consult: Paroxysmal atrial fibrillation with RVR/bradycardia Referring Physician:   [name] is an 73 y.o. female.  HPI:   The patient is a 73 year old female who is visiting [place] from [place] and has a history of hypertension, A. fib, HTN, pancreatitis, hyperlipidemia, diabetes mellitus type 2, anemia, squamous cell carcinoma, mesenteric artery thrombosis, splenic infarction.  Patient was admitted with epigastric abdominal pain.  She is diagnosed with extensive arterial and venous thrombosis of mesenteric vessels while on eliquis. CT abdomen and pelvis revealed Chronic portal and splenic vein occlusion with cavernous transformation and multiple collateral vessels as described.  Omental/mesenteric masses concerning for malignancy. In addition,  there are multiple ill-defined low-density lesions distributed throughout the right hepatic lobe which are associated with intra and extrahepatic biliary dilatation. She underwent a colonoscopy yesterday which revealed normal colon no evidence of ischemic colitis. Her HIT panel was positive.   Liver enzymes are improving.  We are asked to see the patient for atrial fibrillation/bradycardia.  She is currently on bivalirudin.  The plan is to transition to Coumadin.  She has had an episode of atrial fibrillation with RVR and spontaneously converted back to sinus bradycardia with rates in the 50s and which have been very consistent.  Her metoprolol was increased to 25 mg twice daily today she does take sotalol 80 mg BID.     Past Medical History  Diagnosis Date  . Hypertension   . A-fib   . Pancreatitis 2002; 06/18/2014  . High cholesterol   . Type II diabetes mellitus   . Anemia   . History of blood transfusion 2011-04/2014    "couple times related to OR on my legs; twice when I had blood clot in my stomach"  . Squamous carcinoma     "legs", S/P radiation; chemo; OR  . Liver masses 06/19/2014  . Thrombosis of mesenteric artery  06/19/2014    04/12/14  . Splenic infarction 06/19/2014    04/12/14  . Portal vein thrombosis 06/19/2014  . Portal hypertension 06/19/2014  . HTN (hypertension), benign 06/19/2014  . Atrial fibrillation 06/19/2014  . Other pancytopenia 06/19/2014    Suspect med related.  Heparin?    Past Surgical History  Procedure Laterality Date  . Umbilical hernia repair  04/2014  . Tonsillectomy  1940's  . Cholecystectomy  1980"s  . Appendectomy  1980's  . Hernia repair    . Colon surgery  04/2014    "blood clot in stomach due to AF; they cut it out/fixed it"  . Squamous cell carcinoma excision Bilateral 2011-2015; multiple ORs    "legs"  . Cosmetic surgery Bilateral 2011-2015; multiple times    "legs"  . Ercp N/A 06/20/2014    Procedure: ENDOSCOPIC RETROGRADE CHOLANGIOPANCREATOGRAPHY (ERCP);  Surgeon: Beryle Beams, MD;  Location: [facility] ENDOSCOPY;  Service: Endoscopy;  Laterality: N/A;    History reviewed. No pertinent family history.  Social History:  reports that she has quit smoking. Her smoking use included Cigarettes. She has a 5 pack-year smoking history. She has never used smokeless tobacco. She reports that she drinks about 3 ounces of alcohol per week. She reports that she does not use illicit drugs.  Allergies:  Allergies  Allergen Reactions  . Polysporin [Bacitracin-Polymyxin B] Other (See Comments)    unknown    Medications: Scheduled Meds: . famotidine  20 mg Oral BID  . furosemide  40 mg Oral Daily  . insulin aspart  0-15 Units Subcutaneous TID AC & HS  .  insulin glargine  25 Units Subcutaneous QHS  . lisinopril  20 mg Oral BID  . metoprolol tartrate  25 mg Oral BID  . potassium chloride  40 mEq Oral BID  . sodium chloride  3 mL Intravenous Q12H  . sotalol  80 mg Oral BID   Continuous Infusions: . bivalirudin (ANGIOMAX) infusion 0.5 mg/mL (Non-ACS indications) 0.1 mg/kg/hr (06/21/14 1935)   PRN Meds:.morphine injection, ondansetron (ZOFRAN) IV, ondansetron   Results for  orders placed during the hospital encounter of 06/18/14 (from the past 48 hour(s))  GLUCOSE, CAPILLARY     Status: Abnormal   Collection Time    06/21/14  4:09 PM      Result Value Ref Range   Glucose-Capillary 238 (*) 70 - 99 mg/dL  GLUCOSE, CAPILLARY     Status: Abnormal   Collection Time    06/21/14  9:16 PM      Result Value Ref Range   Glucose-Capillary 229 (*) 70 - 99 mg/dL  CBC     Status: Abnormal   Collection Time    06/22/14  3:56 AM      Result Value Ref Range   WBC 3.3 (*) 4.0 - 10.5 K/uL   RBC 3.59 (*) 3.87 - 5.11 MIL/uL   Hemoglobin 10.3 (*) 12.0 - 15.0 g/dL   HCT 30.8 (*) 36.0 - 46.0 %   MCV 85.8  78.0 - 100.0 fL   MCH 28.7  26.0 - 34.0 pg   MCHC 33.4  30.0 - 36.0 g/dL   RDW 14.4  11.5 - 15.5 %   Platelets 85 (*) 150 - 400 K/uL   Comment: CONSISTENT WITH PREVIOUS RESULT  APTT     Status: Abnormal   Collection Time    06/22/14  3:56 AM      Result Value Ref Range   aPTT 60 (*) 24 - 37 seconds   Comment:            IF BASELINE aPTT IS ELEVATED,     SUGGEST PATIENT RISK ASSESSMENT     BE USED TO DETERMINE APPROPRIATE     ANTICOAGULANT THERAPY.  PROTIME-INR     Status: Abnormal   Collection Time    06/22/14  3:56 AM      Result Value Ref Range   Prothrombin Time 18.7 (*) 11.6 - 15.2 seconds   INR 1.56 (*) 0.00 - 1.49  COMPREHENSIVE METABOLIC PANEL     Status: Abnormal   Collection Time    06/22/14  3:56 AM      Result Value Ref Range   Sodium 145  137 - 147 mEq/L   Potassium 3.1 (*) 3.7 - 5.3 mEq/L   Comment: DELTA CHECK NOTED   Chloride 104  96 - 112 mEq/L   CO2 27  19 - 32 mEq/L   Glucose, Bld 153 (*) 70 - 99 mg/dL   BUN 9  6 - 23 mg/dL   Creatinine, Ser 0.57  0.50 - 1.10 mg/dL   Calcium 8.5  8.4 - 10.5 mg/dL   Total Protein 5.8 (*) 6.0 - 8.3 g/dL   Albumin 3.2 (*) 3.5 - 5.2 g/dL   AST 75 (*) 0 - 37 U/L   ALT 285 (*) 0 - 35 U/L   Alkaline Phosphatase 326 (*) 39 - 117 U/L   Total Bilirubin 2.1 (*) 0.3 - 1.2 mg/dL   GFR calc non Af Amer >90  >90  mL/min   GFR calc Af Amer >90  >90 mL/min  Comment: (NOTE)     The eGFR has been calculated using the CKD EPI equation.     This calculation has not been validated in all clinical situations.     eGFR's persistently <90 mL/min signify possible Chronic Kidney     Disease.   Anion gap 14  5 - 15  GLUCOSE, CAPILLARY     Status: Abnormal   Collection Time    06/22/14  5:46 AM      Result Value Ref Range   Glucose-Capillary 146 (*) 70 - 99 mg/dL  GLUCOSE, CAPILLARY     Status: Abnormal   Collection Time    06/22/14 12:17 PM      Result Value Ref Range   Glucose-Capillary 136 (*) 70 - 99 mg/dL  GLUCOSE, CAPILLARY     Status: Abnormal   Collection Time    06/22/14  4:21 PM      Result Value Ref Range   Glucose-Capillary 411 (*) 70 - 99 mg/dL  GLUCOSE, RANDOM     Status: Abnormal   Collection Time    06/22/14  5:05 PM      Result Value Ref Range   Glucose, Bld 446 (*) 70 - 99 mg/dL  GLUCOSE, CAPILLARY     Status: Abnormal   Collection Time    06/22/14  9:16 PM      Result Value Ref Range   Glucose-Capillary 295 (*) 70 - 99 mg/dL  GLUCOSE, CAPILLARY     Status: Abnormal   Collection Time    06/23/14  2:11 AM      Result Value Ref Range   Glucose-Capillary 147 (*) 70 - 99 mg/dL  CBC     Status: Abnormal   Collection Time    06/23/14  3:40 AM      Result Value Ref Range   WBC 2.5 (*) 4.0 - 10.5 K/uL   RBC 3.55 (*) 3.87 - 5.11 MIL/uL   Hemoglobin 10.5 (*) 12.0 - 15.0 g/dL   HCT 31.1 (*) 36.0 - 46.0 %   MCV 87.6  78.0 - 100.0 fL   MCH 29.6  26.0 - 34.0 pg   MCHC 33.8  30.0 - 36.0 g/dL   RDW 14.5  11.5 - 15.5 %   Platelets 93 (*) 150 - 400 K/uL   Comment: CONSISTENT WITH PREVIOUS RESULT  APTT     Status: Abnormal   Collection Time    06/23/14  3:40 AM      Result Value Ref Range   aPTT 55 (*) 24 - 37 seconds   Comment:            IF BASELINE aPTT IS ELEVATED,     SUGGEST PATIENT RISK ASSESSMENT     BE USED TO DETERMINE APPROPRIATE     ANTICOAGULANT THERAPY.  BASIC  METABOLIC PANEL     Status: Abnormal   Collection Time    06/23/14  3:40 AM      Result Value Ref Range   Sodium 140  137 - 147 mEq/L   Potassium 3.7  3.7 - 5.3 mEq/L   Chloride 104  96 - 112 mEq/L   CO2 26  19 - 32 mEq/L   Glucose, Bld 130 (*) 70 - 99 mg/dL   BUN 13  6 - 23 mg/dL   Creatinine, Ser 0.64  0.50 - 1.10 mg/dL   Calcium 8.7  8.4 - 10.5 mg/dL   GFR calc non Af Amer 88 (*) >90 mL/min   GFR calc Af  Amer >90  >90 mL/min   Comment: (NOTE)     The eGFR has been calculated using the CKD EPI equation.     This calculation has not been validated in all clinical situations.     eGFR's persistently <90 mL/min signify possible Chronic Kidney     Disease.   Anion gap 10  5 - 15  HEPATIC FUNCTION PANEL     Status: Abnormal   Collection Time    06/23/14  3:40 AM      Result Value Ref Range   Total Protein 5.6 (*) 6.0 - 8.3 g/dL   Albumin 3.0 (*) 3.5 - 5.2 g/dL   AST 41 (*) 0 - 37 U/L   ALT 183 (*) 0 - 35 U/L   Alkaline Phosphatase 288 (*) 39 - 117 U/L   Total Bilirubin 1.3 (*) 0.3 - 1.2 mg/dL   Bilirubin, Direct 0.4 (*) 0.0 - 0.3 mg/dL   Indirect Bilirubin 0.9  0.3 - 0.9 mg/dL  GLUCOSE, CAPILLARY     Status: Abnormal   Collection Time    06/23/14  6:32 AM      Result Value Ref Range   Glucose-Capillary 131 (*) 70 - 99 mg/dL  GLUCOSE, CAPILLARY     Status: Abnormal   Collection Time    06/23/14 11:19 AM      Result Value Ref Range   Glucose-Capillary 272 (*) 70 - 99 mg/dL    No results found.  ROS Blood pressure 169/63, pulse 50, temperature 98 F (36.7 C), temperature source Oral, resp. rate 18, height 5' 1"  (1.549 m), weight 140 lb 14 oz (63.9 kg), SpO2 99.00%. Physical Exam  Assessment/Plan: Principal Problem:   Abdominal pain, epigastric Active Problems:   Elevated lipase   Hx of ischemic bowel disease   Diabetes   Liver masses   Thrombosis of mesenteric artery   Splenic infarction   Portal vein thrombosis   Portal hypertension   HTN (hypertension),  benign   Atrial fibrillation   Other pancytopenia    HAGER, BRYAN, PAC  06/23/2014, 2:31 PM   I have seen and examined the patient along with HAGER, BRYAN, PAC.  I have reviewed the chart, notes and new data.  I agree with PA's note.  Key new complaints: she feels well now, no palpitations, dizziness or abdominal pain Key examination changes: regular, bradycardic rhythm Key new findings / data: improving platelet count, normal QTc  PLAN: Her bradycardia is mild and asymptomatic. Reduce metoprolol to 12.5 mg BID for now. Most important issue is uninterrupted anticoagulation until therapeutic INR on warfarin- this is logistically complicated by suspected HIT and the fact that she plans to return to [place] soon. If HIT negative, self-administered enoxaparin is an option. If HIT positive, keep in hospital on bivalirudin. Either way, I see no reason to delay initiation of warfarin.  Sanda Klein, MD, [place] [phone] 06/23/2014, 3:01 PM

## 2014-06-23 NOTE — Progress Notes (Addendum)
Internal Medicine Attending  Date: 06/23/2014  Patient name: Wendy Meza Medical record number: [id number] Date of birth: 12/27/1940 Age: 73 y.o. Gender: female  I saw and evaluated the patient and discussed her care with resident Dr. Alice Rieger; see his note for details of clinical findings and plans.  Patient is doing well without any further complaint of abdominal pain.  Platelet count has improved to 93; liver enzymes are improving.  We are still awaiting the confirmatory HIT testing which should be available by tomorrow; we can then make a decision about patient's anti-coagulation regimen at discharge.  For now will continue bivalirudin pending that result.  Patient is having intermittent episodes of atrial fibrillation; she has been on sotalol and metoprolol prescribed by her cardiologist in [place].  We increased her metoprolol dose yesterday and today her baseline rate is bradycardic.  Plans include cardiology consult to advise on management of her atrial fibrillation.  Dr. Ellwood Dense will take over as attending physician tomorrow 06/24/2014.

## 2014-06-23 NOTE — Progress Notes (Signed)
ANTICOAGULATION CONSULT NOTE - Follow Up Consult  Pharmacy Consult for  Bivalirudin Indication: Questionable HIT; hx afib (off apixaban)  Allergies  Allergen Reactions  . Polysporin [Bacitracin-Polymyxin B] Other (See Comments)    unknown    Patient Measurements: Height: 5\' 1"  (154.9 cm) Weight: 140 lb 14 oz (63.9 kg) IBW/kg (Calculated) : 47.8  Vital Signs: Temp: 98 F (36.7 C) (08/16 0518) Temp src: Oral (08/16 0518) BP: 169/63 mmHg (08/16 1116) Pulse Rate: 50 (08/16 0518)  Labs:  Recent Labs  06/21/14 0410 06/21/14 0844 06/22/14 0356 06/23/14 0340  HGB 10.5*  --  10.3* 10.5*  HCT 31.9*  --  30.8* 31.1*  PLT 84*  --  85* 93*  APTT 62*  --  60* 55*  LABPROT  --   --  18.7*  --   INR  --   --  1.56*  --   CREATININE  --  0.62 0.57 0.64    Estimated Creatinine Clearance: 55.2 ml/min (by C-G formula based on Cr of 0.64).   Medications: Bivalrudin at 0.1 mg/kg/hr (6.39 mg/hr)  Assessment: 73 yo F [facility] and acute pancreatitis. The patient was previously on apixaban for anticoagulation in the setting of Afib - this was held on admission and transitioned to heparin for anticoagulation. While on heparin, the patient's platelets had a significant drop by 50% and hematology was consulted. It was recommended to switch the patient to argatroban while ruling out HIT. The patient was also noted to have elevated LFTs and was then transitioned to Bivalrudin.    Bivalrudin continues at 0.1mg /kg/hr the with therapeutic aPTT.  Per lab, pt is HIT +.  I have placed a preliminary paper copy of these results in the patient's paper chart.  Final result will not be available electronically until 8/17.  Goal of Therapy:  aPTT 50-85 seconds Monitor platelets by anticoagulation protocol: Yes   Plan:  Continue bivalrudin at 6.39 mg/hr  Daily aPTT Follow up HIT panel results  In anticipation of transition to Arixtra, would recommend  Arixtra 7.5mg  SQ once daily with the first dose to be given 1 hour after the discontinuation of Bivalrudin.   [employer], Pharm.D., BCPS Clinical Pharmacist Pager [phone] 06/23/2014 12:11 PM

## 2014-06-24 ENCOUNTER — Encounter (HOSPITAL_COMMUNITY): Payer: Self-pay | Admitting: Internal Medicine

## 2014-06-24 ENCOUNTER — Other Ambulatory Visit: Payer: Self-pay | Admitting: Internal Medicine

## 2014-06-24 DIAGNOSIS — D61818 Other pancytopenia: Secondary | ICD-10-CM

## 2014-06-24 DIAGNOSIS — K55069 Acute infarction of intestine, part and extent unspecified: Secondary | ICD-10-CM

## 2014-06-24 DIAGNOSIS — I714 Abdominal aortic aneurysm, without rupture, unspecified: Secondary | ICD-10-CM

## 2014-06-24 DIAGNOSIS — I10 Essential (primary) hypertension: Secondary | ICD-10-CM

## 2014-06-24 DIAGNOSIS — E785 Hyperlipidemia, unspecified: Secondary | ICD-10-CM

## 2014-06-24 DIAGNOSIS — R112 Nausea with vomiting, unspecified: Secondary | ICD-10-CM

## 2014-06-24 DIAGNOSIS — R109 Unspecified abdominal pain: Secondary | ICD-10-CM

## 2014-06-24 DIAGNOSIS — E119 Type 2 diabetes mellitus without complications: Secondary | ICD-10-CM

## 2014-06-24 LAB — GLUCOSE, CAPILLARY
Glucose-Capillary: 150 mg/dL — ABNORMAL HIGH (ref 70–99)
Glucose-Capillary: 269 mg/dL — ABNORMAL HIGH (ref 70–99)

## 2014-06-24 LAB — CBC
HCT: 33 % — ABNORMAL LOW (ref 36.0–46.0)
HEMOGLOBIN: 10.9 g/dL — AB (ref 12.0–15.0)
MCH: 28.7 pg (ref 26.0–34.0)
MCHC: 33 g/dL (ref 30.0–36.0)
MCV: 86.8 fL (ref 78.0–100.0)
Platelets: 112 10*3/uL — ABNORMAL LOW (ref 150–400)
RBC: 3.8 MIL/uL — ABNORMAL LOW (ref 3.87–5.11)
RDW: 14.7 % (ref 11.5–15.5)
WBC: 3.2 10*3/uL — ABNORMAL LOW (ref 4.0–10.5)

## 2014-06-24 LAB — BASIC METABOLIC PANEL
ANION GAP: 11 (ref 5–15)
BUN: 11 mg/dL (ref 6–23)
CHLORIDE: 107 meq/L (ref 96–112)
CO2: 26 mEq/L (ref 19–32)
CREATININE: 0.63 mg/dL (ref 0.50–1.10)
Calcium: 9.1 mg/dL (ref 8.4–10.5)
GFR calc non Af Amer: 88 mL/min — ABNORMAL LOW (ref >90)
Glucose, Bld: 150 mg/dL — ABNORMAL HIGH (ref 70–99)
Potassium: 4.2 mEq/L (ref 3.7–5.3)
Sodium: 144 mEq/L (ref 137–147)

## 2014-06-24 LAB — HEPATIC FUNCTION PANEL
ALK PHOS: 288 U/L — AB (ref 39–117)
ALT: 146 U/L — AB (ref 0–35)
AST: 33 U/L (ref 0–37)
Albumin: 3.2 g/dL — ABNORMAL LOW (ref 3.5–5.2)
BILIRUBIN DIRECT: 0.4 mg/dL — AB (ref 0.0–0.3)
BILIRUBIN TOTAL: 1.1 mg/dL (ref 0.3–1.2)
Indirect Bilirubin: 0.7 mg/dL (ref 0.3–0.9)
Total Protein: 5.9 g/dL — ABNORMAL LOW (ref 6.0–8.3)

## 2014-06-24 LAB — APTT: APTT: 58 s — AB (ref 24–37)

## 2014-06-24 MED ORDER — FONDAPARINUX SODIUM 7.5 MG/0.6ML ~~LOC~~ SOLN
7.5000 mg | Freq: Every day | SUBCUTANEOUS | Status: DC
  Administered 2014-06-24: 7.5 mg via SUBCUTANEOUS
  Filled 2014-06-24 (×2): qty 0.6

## 2014-06-24 MED ORDER — WARFARIN - PHYSICIAN DOSING INPATIENT
Freq: Every day | Status: DC
  Administered 2014-06-24: 10:00:00

## 2014-06-24 MED ORDER — WARFARIN SODIUM 5 MG PO TABS: 5.0000 mg | ORAL_TABLET | Freq: Every day | ORAL | Status: DC

## 2014-06-24 MED ORDER — PATIENT'S GUIDE TO USING COUMADIN BOOK
Freq: Once | Status: AC
  Administered 2014-06-24: 10:00:00
  Filled 2014-06-24: qty 1

## 2014-06-24 MED ORDER — FONDAPARINUX SODIUM 7.5 MG/0.6ML ~~LOC~~ SOLN: 7.5000 mg | Freq: Every day | SUBCUTANEOUS | Status: DC

## 2014-06-24 MED ORDER — WARFARIN SODIUM 2.5 MG PO TABS: 2.5000 mg | ORAL_TABLET | Freq: Every day | ORAL | Status: DC

## 2014-06-24 MED ORDER — METOPROLOL TARTRATE 25 MG PO TABS: 12.5000 mg | ORAL_TABLET | Freq: Two times a day (BID) | ORAL | Status: AC

## 2014-06-24 MED ORDER — WARFARIN SODIUM 5 MG PO TABS
5.0000 mg | ORAL_TABLET | Freq: Once | ORAL | Status: AC
  Administered 2014-06-24: 5 mg via ORAL
  Filled 2014-06-24: qty 1

## 2014-06-24 NOTE — Discharge Instructions (Signed)
·   Thank you for allowing [place] to be involved in your healthcare while you were hospitalized at [facility].   Please note that there have been changes to your home medications.  --> PLEASE LOOK AT YOUR DISCHARGE MEDICATION LIST FOR DETAILS.   Please call the Internal Medicine Clinic [phone] if you have any questions or concerns, or any difficulty getting any of your medications.  Please return to the ER if you have worsening of your symptoms or new severe symptoms arise.  Please pick up your Arixtra injections from the [place] [facility] per Dr. Azucena Freed instructions.  You received one dose of Coumadin today.  Take your next dose of Coumadin 5 mg tomorrow evening.  Make sure to come to the Internal Medicine Clinic on Thursday morning to get your blood drawn to make sure you are on the correct dose of Coumadin.  Also, come to your appointment in the Internal Medicine Clinic with Dr. Trudee Kuster on 07/05/14 to see how you are doing and check your labs again.

## 2014-06-24 NOTE — Progress Notes (Signed)
Patient ID: Wendy Meza, female   DOB: [birth year], 73 y.o.   MRN: [id number] Transient  A fib over weekend; back in sinus. Platelets up to 112,000, bilirubin down to 1.3. No further abdominal pain and Colonoscopy negative for malignancy. Per my discussion w Dr Marinda Elk, screeening platelet factor 4 ELIZA was positive at a low optical density. Still waiting on C14 Serotonin release confirmatory assay!!! Rather than delay discharge, I feel it is acceptable to begin Arixtra (fondaparinux). 64 kg  Therapeutic dose is 7.5 mg SQ daily. Start LOW DOSE warfarin 2.5 mg PO daily tomorrow as outpatient. Check PT/INR and platelet count on Friday this week - expect coumadin will still be subtherapeutic - can then increase to 5 mg daily and repeat PT/INR & platelet count after 4 doses at 5 mg and make further adjustments from there. I would call her Cardiologist in [place] who will likely be managing her A fib/anticoagulants and give him a summary of events and our recommendations. I have been able to procure a 2 week supply of Arixtra 7.5 mg for her from the [facility] and I gave her written instructions of where to go to pick up the samples. Thanks!

## 2014-06-24 NOTE — Progress Notes (Addendum)
Subjective:    Currently, the patient continues to feel well.  She is anxious about being discharged, so she can be with her son who is having surgery tomorrow.  She denies nausea, abdominal pain, chest tightness, shortness of breath, palpitations, or headache.  Interval Events: -Cardiology consulted and recommended decreasing metoprolol back to 12.5 mg BID.  Recommended starting warfarin now. -HIT panel still pending. -In and out of afib with HR up to 120s or down to 50s in sinus.   Objective:    Vital Signs:   Temp:  [98.3 F (36.8 C)-98.4 F (36.9 C)] 98.4 F (36.9 C) (08/17 0430) Pulse Rate:  [53-124] 53 (08/17 0430) Resp:  [18] 18 (08/17 0430) BP: (140-169)/(58-77) 140/59 mmHg (08/17 0430) SpO2:  [97 %-100 %] 97 % (08/17 0430) Last BM Date: 06/22/14  24-hour weight change: Weight change:   Intake/Output:   Intake/Output Summary (Last 24 hours) at 06/24/14 0736 Last data filed at 06/24/14 0600  Gross per 24 hour  Intake 1094.4 ml  Output   2250 ml  Net -1155.6 ml      Physical Exam: General: Vital signs reviewed and noted. Well-developed, well-nourished, in no acute distress; alert, appropriate and cooperative throughout examination.  Lungs:  Normal respiratory effort. Clear to auscultation BL without crackles or wheezes.  Heart: RRR. S1 and S2 normal without gallop, murmur, or rubs.  Abdomen:  Tender to deep palpation, L>R. Organomegaly not appreciated on exam. BS normoactive. Soft, non-distended.  Extremities: No pretibial edema. Skin scars on lower extremities bilaterally with skin graft on left shin.     Labs:  Basic Metabolic Panel:  Recent Labs Lab 06/20/14 0017 06/21/14 0844 06/22/14 0356 06/22/14 1705 06/23/14 0340 06/24/14 0355  NA 140 140 145  --  140 144  K 3.5* 4.0 3.1*  --  3.7 4.2  CL 104 107 104  --  104 107  CO2 27 22 27   --  26 26  GLUCOSE 184* 225* 153* 446* 130* 150*  BUN 13 11 9   --  13 11  CREATININE 0.68 0.62 0.57  --  0.64  0.63  CALCIUM 8.5 8.2* 8.5  --  8.7 9.1    Liver Function Tests:  Recent Labs Lab 06/19/14 0805 06/20/14 0017 06/21/14 0844 06/22/14 0356 06/23/14 0340  AST 1217* 494* 126* 75* 41*  ALT 787* 576* 355* 285* 183*  ALKPHOS 186* 228* 268* 326* 288*  BILITOT 4.0* 4.1* 2.7* 2.1* 1.3*  PROT 6.1 5.7* 5.6* 5.8* 5.6*  ALBUMIN 3.3* 3.1* 2.9* 3.2* 3.0*    Recent Labs Lab 06/18/14 1333 06/20/14 1132  LIPASE >3000* 85*   CBC:  Recent Labs Lab 06/18/14 1333  06/19/14 1500 06/20/14 0017 06/21/14 0410 06/22/14 0356 06/23/14 0340 06/24/14 0355  WBC 6.9  < > 2.7* 2.7* 3.3* 3.3* 2.5* 3.2*  NEUTROABS 5.5  --  1.7 1.7  --   --   --   --   HGB 12.2  < > 9.9* 10.0* 10.5* 10.3* 10.5* 10.9*  HCT 36.4  < > 29.2* 29.3* 31.9* 30.8* 31.1* 33.0*  MCV 86.9  < > 86.6 85.9 87.9 85.8 87.6 86.8  PLT 123*  < > 58* 62* 84* 85* 93* 112*  < > = values in this interval not displayed. CBG:  Recent Labs Lab 06/23/14 0632 06/23/14 1119 06/23/14 1618 06/23/14 2100 06/24/14 0614  GLUCAP 131* 272* 435* 246* 150*   Coagulation Studies:  Recent Labs  06/22/14 0356  LABPROT 18.7*  INR 1.56*  Other Results: HIT panel pending.  Lab Results  Component Value Date   AFPTM 4.5 06/19/2014     Medications:    Infusions: . bivalirudin (ANGIOMAX) infusion 0.5 mg/mL (Non-ACS indications) 0.1 mg/kg/hr (06/23/14 1952)    Scheduled Medications: . famotidine  20 mg Oral BID  . furosemide  40 mg Oral Daily  . insulin aspart  0-15 Units Subcutaneous TID AC & HS  . insulin glargine  25 Units Subcutaneous QHS  . lisinopril  20 mg Oral BID  . metoprolol tartrate  12.5 mg Oral BID  . sodium chloride  3 mL Intravenous Q12H  . sotalol  80 mg Oral BID    PRN Medications: morphine injection, ondansetron (ZOFRAN) IV, ondansetron   Assessment/ Plan:    Principal Problem:   Abdominal pain, epigastric Active Problems:   Elevated lipase   Hx of ischemic bowel disease   Diabetes   Liver masses    Thrombosis of mesenteric artery   Splenic infarction   Portal vein thrombosis   Portal hypertension   HTN (hypertension), benign   Atrial fibrillation   Other pancytopenia  #Abdominal pain with new masses on CT Patient presented with epigastric abdominal pain, nausea, and vomiting with a history of acute pancreatitis in 1856 complicated by pseudocyst that had to be drained and previous cholecystectomy.  Also underwent thrombectomy for SMA thrombus on on 04/12/14.  Lipase >3000.  AST, ALT, and T bili elevated with elevation of Alk Phos. Lipid panel normal with no hypertriglyceridemia. CXR normal.  Abdominal US demonstrated CBD dilation up to 1.8 cm with intrahepatic biliary ductal dilation and a R hepatic lobe cystic lesion. Originally abdominal pain thought to be consistent with gallstone pancreatitis. CT A/P showed no evidence of acute pancreatitis, but showed omental and mesenteric masses along with multiple ill-defined lesions in R hepatic lobe concerning for malignancy.  MRI liver showed heterogenous enhancement and multi-focal cystic change, which would be atypical for malignancy.  Reviewed images with radiology who thought the liver changes could be related to venous thrombosis with abdominal psuedoaneurisms.  GI (Dr. Benson Norway) consulted who attempted ERCP, but ampulla could not be accessed.  Colonoscopy performed and normal on 06/22/14.  Dr. Benson Norway recommends no further work-up.  Plan to follow up in [facility] in 2 weeks for repeat labs and MRI in 4-6 weeks in [place].  Currently, tolerating full diet well. -Continue carb-modified diet. -Address anticoagulation as below. -Continue morphine 0.5-1 mg IV q4h PRN. -Continue Zofran 4 mg q6h PRN. -Follow up in [facility] in 2 weeks. -Repeat MRI in 4-6 weeks in [place].  #Hypercoagulability Recent SMA thrombosis attributed to atrial fibrillation.  Now with probable portal venous thrombosis as well on Eliquis, suggesting underlying hypercoagulability.  Hem/onc consulted.  Work-up negative to this point with borderline increased anti-beta-2-glycoprotein and decreased protein S activity that does not explain her clinical picture.  NOACs not studied in this situation, and Dr. Beryle Beams recommends Lovenox 1.5 mg/kg/day or Arixtra until platelet function improved then warfarin in 1-2 weeks.  Cardiology recommends starting warfarin now.  Pt did not think she could afford Arixtra due to being in donut hole for insurance, so Dr. Darnell Level was able to get samples from the [place] from bivalirudin to Arixtra today, first does of Arixtra 1 hour after stopping bivalirudin per pharmacy. -Received warfarin 5 mg today per pharmacy recs, but will continue with 2.5 mg daily tomorrow per recommendation of Dr. Beryle Beams. -Continue Arixtra for 5 day overlap while starting warfarin. -Follow  up in [facility] for labs on Thursday to adjust warfarin dosing.  #Acute onset pancytopenia WBC (6.9>2.4), Hgb (12.2>8.8), Platelets (123>54) all decreased after receiving only heparin and Dilaudid on admission.  Concern for HIT, but last heparin exposure >30 days ago.  4 Ts score = 4 intermediate probability.  Hem/onc consulted and raised concern for early DIC, but no schistocytes on blood smear.  D-dimer elevated at 4.13, but fibrinogen normal.  Talked to the lab about HIT panel, which was positive now awaiting confirmatory test that requires sample be sent to [place] and will take 3 more days.  Decreased cell counts as a result of acute liver failure also possible.  Will bridge with Arixtra while starting warfarin to avoid heparin.  Cell counts continue to improve today. -No heparin until HIT panel back. -Anticoagulate as above. -Follow CBC.  #Atrial fibrillation Possible cause of SMA thrombosis, but not causing hepatic thrombosis.  On Eliquis and sotalol at home.  Alternating between sinus and Afib with RVR with low and high rates as a result.  Cardiology consulted and recommended  decreasing metoprolol back to 12.5 mg BID along with starting warfarin now. -Stop home Eliquis 5 mg BID. Anticoagulate as above. -Will contact her cardiologist in [place] (Dr. Orbie Hurst) to let him know we are switching her anticoagulation. -Continue sotalol 80 mg BID to maintain sinus rhythm. -Reduce metoprolol to 12.5 mg BID per cardiology recs. -On telemetry.  #DM2 On metformin 500 mg BID and Lantus 30 units QHS with Humalog correction at home. CBGs improved this morning after increasing Lantus to 25 units QHS. -Holding metformin. -Continue Lantus 25 units QHS. -SSI moderate.  #HTN Patient reports taking metoprolol 25 mg daily at home, but discharge from [facility] says BID.  Cardiology consulted and recommends metoprolol 12.5 mg BID due to bradycardia when in sinus with 25 mg BID.  Patient refuses amlodipine due to history of LE edema.  BP improved since starting furosemide. -Continue home lisinopril 20 mg BID, furosemide 40 mg daily. -Switch back to metoprolol 12.5 mg BID. -Consider clonipine or hydralazine if BP elevated.  Could also switch metop to carvedilol.  #HLD Lipid panel cholesterol 73, triglycerides 73, HDL 47, LDL 11. -Continue home Crestor 20 mg QHS.   DVT PPX - SCD's while in bed  CODE STATUS - Full code  CONSULTS PLACED - GI, Hem/onc, Cardiology  DISPO - Disposition is deferred at this time, awaiting colonoscopy.   Anticipated discharge in approximately 1 day(s).   The patient does have a current PCP Solmon Ice [phone]) and does need an [facility] hospital follow-up appointment after discharge.    Is the [facility] hospital follow-up appointment a one-time only appointment? Not applicable.  Does the patient have transportation limitations that hinder transportation to clinic appointments? no   SERVICE NEEDED AT [place]         Y = Yes, Blank = No PT:   OT:   RN:   Equipment:   Other:      Length of Stay: 6  day(s)   Signed: Arman Filter, MD  PGY-1, Internal Medicine Resident Pager: [phone] (7AM-5PM) 06/24/2014, 7:36 AM

## 2014-06-24 NOTE — Progress Notes (Signed)
Patient Name: Wendy Meza Date of Encounter: 06/24/2014  Principal Problem:   Abdominal pain, epigastric Active Problems:   Elevated lipase   Hx of ischemic bowel disease   Diabetes   Liver masses   Thrombosis of mesenteric artery   Splenic infarction   Portal vein thrombosis   Portal hypertension   HTN (hypertension), benign   Atrial fibrillation   Other pancytopenia    Patient Profile: 73 yo female w/ hx PAF, HTN, HLD, DM, anemia, squamous cell CA, mesenteric artery thrombosis, splenic infarction. Admitted 08/11 with extensive arterial and venous thrombosis of mesenteric vessels while on Eliquis, plus omental/mesenteric masses concerning for malignancy. Plt 123 on admit, decreased to 54 after heparin, now on bivalirudin.  Cards consulted for PAF and anticoag.   SUBJECTIVE: Aware of atrial fib last pm, no obvious symptoms from bradycardia  OBJECTIVE Filed Vitals:   06/23/14 2203 06/23/14 2345 06/24/14 0100 06/24/14 0430  BP:  154/77  140/59  Pulse: 121 124 54 53  Temp:    98.4 F (36.9 C)  TempSrc:    Oral  Resp:    18  Height:      Weight:      SpO2:    97%    Intake/Output Summary (Last 24 hours) at 06/24/14 0800 Last data filed at 06/24/14 0600  Gross per 24 hour  Intake 1094.4 ml  Output   2250 ml  Net -1155.6 ml   Filed Weights   06/18/14 1955 06/19/14 0426  Weight: 140 lb 14 oz (63.9 kg) 140 lb 14 oz (63.9 kg)    PHYSICAL EXAM General: Well developed, well nourished, female in no acute distress. Head: Normocephalic, atraumatic.  Neck: Supple without bruits, JVD not elevated. Lungs:  Resp regular and unlabored, CTA. Heart: RRR, S1, S2, no S3, S4, or murmur; no rub. Abdomen: Soft, non-tender, non-distended, BS + x 4.  Extremities: No clubbing, cyanosis, no edema.  Neuro: Alert and oriented X 3. Moves all extremities spontaneously. Psych: Normal affect.  LABS: CBC: Recent Labs  06/23/14 0340 06/24/14 0355  WBC 2.5* 3.2*  HGB 10.5* 10.9*    HCT 31.1* 33.0*  MCV 87.6 86.8  PLT 93* 112*   INR: Recent Labs  06/22/14 0356  INR 8.67*   Basic Metabolic Panel: Recent Labs  06/23/14 0340 06/24/14 0355  NA 140 144  K 3.7 4.2  CL 104 107  CO2 26 26  GLUCOSE 130* 150*  BUN 13 11  CREATININE 0.64 0.63  CALCIUM 8.7 9.1   Liver Function Tests: Recent Labs  06/22/14 0356 06/23/14 0340  AST 75* 41*  ALT 285* 183*  ALKPHOS 326* 288*  BILITOT 2.1* 1.3*  PROT 5.8* 5.6*  ALBUMIN 3.2* 3.0*   TELE:  Afib, RVR from about 2 pm to midnight yesterday. Otherwise SR, generally sinus brady, high 40s, low 50s.     Current Medications:  . famotidine  20 mg Oral BID  . furosemide  40 mg Oral Daily  . insulin aspart  0-15 Units Subcutaneous TID AC & HS  . insulin glargine  25 Units Subcutaneous QHS  . lisinopril  20 mg Oral BID  . metoprolol tartrate  12.5 mg Oral BID  . sodium chloride  3 mL Intravenous Q12H  . sotalol  80 mg Oral BID   . bivalirudin (ANGIOMAX) infusion 0.5 mg/mL (Non-ACS indications) 0.1 mg/kg/hr (06/23/14 1952)    ASSESSMENT AND PLAN:   Atrial fibrillation, rapid - spont converted to SR last pm, sotalol  is PTA med, Lopressor 25 mg daily PTA med. Now decreased. MD advise on med changes.     Chronic anticoagulation - on Eliquis PTA, thrombosed, now on Angiomax, will be loaded with warfarin, bridging with Arixtra as OP arranged by IM.  Otherwise, per IM. Pt is for D/C today. F/u with cardiology PRN. Principal Problem:   Abdominal pain, epigastric  Active Problems:   Elevated lipase   Hx of ischemic bowel disease   Diabetes   Liver masses   Thrombosis of mesenteric artery   Splenic infarction   Portal vein thrombosis   Portal hypertension   HTN (hypertension), benign   Other pancytopenia   Signed, Rosaria Ferries , PA-C 8:00 AM 06/24/2014  I have examined the patient and reviewed assessment and plan and discussed with patient.  Agree with above as stated.  Transient AFib.  Some bradycardia at  times as well.  No indication for pacer at this time.  Anticoagulation plan outlined by Dr. Beryle Beams.  She will f/u with cardiology in [place] when she returns there in about 2 weeks.  INR to be checked here in [place] until that time. We discussed importance of maintatining anticoagulation with COumadin for stroke prevention.  She previously took Eliquis.  Will sign off.  Please call with questions.   Aerionna Moravek S.

## 2014-06-24 NOTE — Care Management Note (Signed)
    Page 1 of 2   06/24/2014     2:21:48 PM CARE MANAGEMENT NOTE 06/24/2014  Patient:  Wendy Meza, Wendy Meza   Account Number:  [id number]  Date Initiated:  06/19/2014  Documentation initiated by:  Lorne Skeens  Subjective/Objective Assessment:   Patient was admitted with epigastric abdominal pain. Currently visiting [place] from [place].  Lives at home with spouse, staying with son while in [place].     Action/Plan:   Will follow for discharge needs.   Anticipated DC Date:  06/24/2014   Anticipated DC Plan:  [place]  CM consult      [facility] Choice  HOME HEALTH   Choice offered to / List presented to:  C-1 Patient        [place] arranged  [place] - 11 Patient Refused      Status of service:  Completed, signed off Medicare Important Message given?  YES (If response is "NO", the following Medicare IM given date fields will be blank) Date Medicare IM given:  06/21/2014 Medicare IM given by:  [facility] Date Additional Medicare IM given:  06/24/2014 Additional Medicare IM given by:  Mija Effertz  Discharge Disposition:  HOME/SELF CARE  Per UR Regulation:  Reviewed for med. necessity/level of care/duration of stay  If discussed at [place] of Stay Meetings, dates discussed:    Comments:  06/21/2014 1300 NCM spoke to pt and she signed release form to send medical records to her PCP's office. States she does not want HH if recommended at dc. States she has RW at home. She will stay with her son until she travels back to [facility] in app 2 weeks. No other NCM needs identified. Will continue to follow until dc. Jonnie Finner RN CCM Case Mgmt phone [phone]  06/21/2014 1200 NCM received call from pt's PCP, office Dr. Guerry Minors. Spoke to RN CM, Santiago Glad. Requested pt's progress notes, op notes, dc summary and lab reports. Pt signed release of medical records to PCP's office. States [facility] # [phone] is used for [facility] in their area. Jonnie Finner RN CCM Case Mgmt phone  [phone]  PCP [place], Dr. Guerry Minors. Fax [phone]

## 2014-06-24 NOTE — Progress Notes (Signed)
Pt educated on self administration of subq blood thinner, pt preformed demonstration and asked questions, all questions were answered and pt self administered medication Rickard Rhymes, RN

## 2014-06-24 NOTE — Discharge Summary (Signed)
Name: Wendy Meza MRN: [id number] DOB: [birth year] 73 y.o. PCP: Provider Default, MD  Date of Admission: 06/18/2014 12:50 PM Date of Discharge: 06/24/2014 Attending Physician: No att. providers found  Discharge Diagnosis:  Principal Problem:   Portal vein thrombosis Active Problems:   Elevated lipase   Hx of ischemic bowel disease   Diabetes   Liver masses   Thrombosis of mesenteric artery   Splenic infarction   Portal hypertension   HTN (hypertension), benign   Atrial fibrillation, rapid   Other pancytopenia   Abdominal pain, epigastric  Discharge Medications:   Medication List    STOP taking these medications       ELIQUIS 5 MG Tabs tablet  Generic drug:  apixaban      TAKE these medications       CRESTOR 20 MG tablet  Generic drug:  rosuvastatin  Take 20 mg by mouth at bedtime.     fondaparinux 7.5 MG/0.6ML Soln injection  Commonly known as:  ARIXTRA  Inject 0.6 mLs (7.5 mg total) into the skin daily at 6 (six) AM.     furosemide 40 MG tablet  Commonly known as:  LASIX  Take 40 mg by mouth daily.     HUMALOG KWIKPEN 100 UNIT/ML KiwkPen  Generic drug:  insulin lispro  Inject 10 Units into the skin daily as needed (takes 10 units if CBG > 160).     KLOR-CON M20 20 MEQ tablet  Generic drug:  potassium chloride SA  Take 20 mEq by mouth 2 (two) times daily.     LANTUS SOLOSTAR 100 UNIT/ML Solostar Pen  Generic drug:  Insulin Glargine  Inject 30 Units into the skin at bedtime.     lisinopril 40 MG tablet  Commonly known as:  PRINIVIL,ZESTRIL  Take 20 mg by mouth 2 (two) times daily.     metFORMIN 500 MG tablet  Commonly known as:  GLUCOPHAGE  Take 500 mg by mouth 2 (two) times daily.     metoprolol tartrate 25 MG tablet  Commonly known as:  LOPRESSOR  Take 0.5 tablets (12.5 mg total) by mouth 2 (two) times daily.     NITROSTAT 0.4 MG SL tablet  Generic drug:  nitroGLYCERIN  Place 0.4 mg under the tongue every 5 (five) minutes as needed.     ranitidine 150 MG tablet  Commonly known as:  ZANTAC  Take 150 mg by mouth 2 (two) times daily.     sotalol 80 MG tablet  Commonly known as:  BETAPACE  Take 80 mg by mouth 2 (two) times daily.     warfarin 2.5 MG tablet  Commonly known as:  COUMADIN  Take 1 tablet (2.5 mg total) by mouth daily.        Disposition and follow-up:   [name] was discharged from [facility] in Good condition.  At the hospital follow up visit please address:  1.  Compliance with Arixtra and warfarin, resolution of nausea and vomiting, follow-up with PCP and cardiologist in [place], whether or not patient has HIT, coordinate MRI imaging with PCP.  2.  Labs / imaging needed at time of follow-up: INR, PTT, CMP, CBC on 06/27/14 and 07/05/14.  Repeat MRI w/ and w/o contrast in 6 weeks (08/01/14) in [place] with PCP.  3.  Pending labs/ test needing follow-up: HIT panel.  Follow-up Appointments: Follow-up Information   Follow up with Arman Filter, MD On 07/05/2014. (3:45 pm)    Specialty:  Internal Medicine   Contact  information:   [address] [place] [address] [phone]       Follow up with Sanda Klein, MD. ([place]. See as needed.)    Specialty:  Cardiology   Contact information:   [address] [place] [address] [phone]       Follow up with [place] On 06/27/2014. (10:30 am-For INR check)    Contact information:   [address] [place] [address] [phone]      Follow up with Default, Provider, MD.      Follow up with Berna Spare On 06/15/5026. (10:45 am)    Contact information:   [facility] Administration Office [address] [place] [address] Phone: [phone]      Discharge Instructions:  Thank you for allowing [place] to be involved in your healthcare while you were hospitalized at [facility].   Please note that there have  been changes to your home medications.  --> PLEASE LOOK AT YOUR DISCHARGE MEDICATION LIST FOR DETAILS.   Please call the Internal Medicine Clinic [phone] if you have any questions or concerns, or any difficulty getting any of your medications.  Please return to the ER if you have worsening of your symptoms or new severe symptoms arise.  Please pick up your Arixtra injections from the [place] at [facility] per Dr. Azucena Freed instructions.  You received one dose of Coumadin today.  Take your next dose of Coumadin 5 mg tomorrow evening.  Make sure to come to the Internal Medicine Clinic on Thursday morning to get your blood drawn to make sure you are on the correct dose of Coumadin.  Also, come to your appointment in the Internal Medicine Clinic with Dr. Trudee Kuster on 07/05/14 to see how you are doing and check your labs again. Discharge Instructions   Call MD for:  extreme fatigue    Complete by:  As directed      Call MD for:  persistant dizziness or light-headedness    Complete by:  As directed      Call MD for:  persistant nausea and vomiting    Complete by:  As directed      Call MD for:  severe uncontrolled pain    Complete by:  As directed      Call MD for:  temperature >100.4    Complete by:  As directed      Diet - low sodium heart healthy    Complete by:  As directed      Increase activity slowly    Complete by:  As directed            Consultations: Treatment Team:  Annia Belt, MD [facility], MD  Procedures Performed:  Dg Chest 2 View  06/18/2014   CLINICAL DATA:  Chest pain  EXAM: CHEST  2 VIEW  COMPARISON:  None.  FINDINGS: The cardiac and mediastinal silhouettes are stable in size and contour, and remain within normal limits.  The lungs are normally inflated. No airspace consolidation, pleural effusion, or pulmonary edema is identified. There is no pneumothorax.  No acute osseous abnormality identified. Minimal degenerative  spurring noted within the mid thoracic spine. Surgical clips overlie the left axilla.  IMPRESSION: No active cardiopulmonary disease.   Electronically Signed   By: Jeannine Boga M.D.   On: 06/18/2014 14:00   US Abdomen Complete  06/18/2014   CLINICAL DATA:  Upper abdominal pain, elevated LFTs  EXAM: ULTRASOUND ABDOMEN COMPLETE  COMPARISON:  None.  FINDINGS: Gallbladder:  Surgically absent  Common bile duct:  Diameter: CBD dilatation with CBD measuring 18.3 mm.  Liver:  There is heterogeneous liver with intrahepatic biliary ductal dilatation. Hypoechoic cystic lesion in right hepatic lobe centrally measures 1.7 x 1.5 cm.  IVC:  No abnormality visualized.  Pancreas:  Limited assessment due to abundant bowel gas  Spleen:  Size and appearance within normal limits.  Right Kidney:  Length: 11 cm. Echogenicity within normal limits. No mass or hydronephrosis visualized.  Left Kidney:  Length: 12.9 cm. Echogenicity within normal limits. No mass or hydronephrosis visualized.  Bilateral renal cortical thinning probable due to atrophy.  Abdominal aorta:  No aneurysm visualized.  Measures up to 2.7 cm in diameter.  Other findings:  None.  IMPRESSION: 1. Surgically absent gallbladder.  CBD dilatation up to 1.8 cm. 2. There is intrahepatic biliary ductal dilatation. Cystic lesion in right hepatic lobe centrally measures 1.7 x 1.5 cm. Further correlation with enhanced CT or MRI could be performed. 3. Bilateral renal cortical thinning probable due to atrophy. No hydronephrosis. No focal renal mass. 4. No aortic aneurysm.   Electronically Signed   By: Lahoma Crocker M.D.   On: 06/18/2014 16:18   Ct Abdomen Pelvis W Contrast  06/19/2014   CLINICAL DATA:  Abdominal pain. History of pancreatitis, diabetes and hypertension.  EXAM: CT ABDOMEN AND PELVIS WITH CONTRAST  TECHNIQUE: Multidetector CT imaging of the abdomen and pelvis was performed using the standard protocol following bolus administration of intravenous contrast.   CONTRAST:  80 ml Omnipaque 300.  COMPARISON:  Abdominal ultrasound same date.  FINDINGS: Lung bases: Clear. No significant pleural or pericardial effusion.  Liver/Biliary/Pancreas: The gallbladder is surgically absent. There is moderate intra and extrahepatic biliary dilatation. The common hepatic duct measures up to 1.7 cm in diameter. The common bile duct tapers distally within the pancreatic head. No calcified intraductal stone or pancreatic mass is identified. There is no evidence of pancreatitis, pancreatic ductal dilatation or surrounding fluid collection. The pancreatic head appears normal. The pancreatic body and tail are markedly diminutive. Multiple ill-defined low-density lesions are present throughout the right hepatic lobe. There is no well-defined mass.  Spleen/Adrenal glands: The spleen is enlarged with mildly heterogeneous density. No focal lesions are identified. There is no adrenal mass.  Kidneys/Ureters/Bladder: There is a small nonobstructing calculus in the mid right kidney. No other urinary tract calculi are demonstrated. There is no evidence of renal mass, hydronephrosis or bladder lesion.  Bowel/Peritoneum: There is diffuse gastric wall thickening, especially in the antrum. No small bowel abnormalities are identified. The appendix is not visualized. There is questionable circumferential wall thickening of the distal transverse colon (axial image 42). There is no evidence of associated bowel obstruction. However, there is an adjacent bilobed mass measuring up to 3.5 x 3.2 cm transverse on image 38. This has fat density along its periphery and could reflect an atypical area of fat necrosis. Within the base the mesentery, there is a low-density structure measuring 2.3 x 2.7 cm. This is adjacent to a surgical clip and could reflect a postoperative finding. There is no ascites.  Retroperitoneum/Pelvis: There are no enlarged abdominal or pelvic lymph nodes. There is mild aortoiliac atherosclerosis.  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 In addition, the splenic vein appears chronically occluded. There are numerous left upper quadrant abdominal varices. In addition, there are multiple large varices of the  right ovarian vein. No adnexal mass is seen. There is a small calcified uterine fibroid.  Abdominal wall: Postsurgical changes are present within the anterior abdominal wall. There is no evidence of abdominal wall mass or hernia.  Musculoskeletal: No acute or significant osseus findings.  IMPRESSION: 1. No evidence of acute pancreatitis. The pancreatic body and tail are markedly diminutive, possibly secondary to prior pancreatitis or congenital variant. 2. 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 3. Omental/mesenteric masses concerning for malignancy. In addition, there are multiple ill-defined low-density lesions distributed throughout the right hepatic lobe which are associated with intra and extrahepatic biliary dilatation. Infiltrating hepatic malignancy is a consideration. 4. Abdominal MRI without and with contrast may be helpful to further evaluate the liver. Alternatively, PET-CT could be performed to assess for metabolic activity within the liver and mesenteric lesions.   Electronically Signed   By: Camie Patience M.D.   On: 06/19/2014 07:44   Mr Liver W Wo Contrast  06/20/2014   CLINICAL DATA:  CT demonstrating liver lesions. History pancreatitis. Diabetes and hypertension.  EXAM: MRI ABDOMEN WITHOUT AND WITH CONTRAST (INCLUDING MRCP)  TECHNIQUE: Multiplanar multisequence MR imaging of the abdomen was performed both before and after the administration of intravenous contrast. Heavily T2-weighted images of the biliary and pancreatic ducts were obtained, and three-dimensional MRCP images were rendered by post processing.  CONTRAST:  16m MULTIHANCE GADOBENATE DIMEGLUMINE 529 MG/ML IV SOLN   COMPARISON:  CT dated 06/18/2014.  FINDINGS: Mild cardiomegaly, without pericardial or pleural effusion.  No definite evidence of cirrhosis. Nonspecific focus of precontrast T1 hyperintensity within the high right lobe of the liver on image 26/series 1500. Heterogeneous enhancement with multi focal hypoenhancing/cystic foci throughout the dome of the right lobe of the liver, extending more inferiorly to straddle the anterior and posterior segments. This area is somewhat ill-defined, but measures on the order of 9.2 x 9.1 cm on image 24/series 15002. Vessels and dilated intrahepatic ducts appear to traverse this area, including on coronal image 30 of series 16.  A more vague area of similar morphology extends into the posterior segment right lobe of the liver on image 30/series 15003.  Nonspecific focus of arterial hyper enhancement in the subcapsular right lobe on image 41/series 1501. Likely a perfusion anomaly.  Mild splenomegaly, greater than 13 cm craniocaudal.  Small hiatal hernia.  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 There is no pancreatic ductal dilatation or dominant pancreatic mass. Moderate common duct dilatation, including at 1.6 cm on image 50/series 6. Mild to moderate intrahepatic ductal dilatation. No obstructive stone or mass identified.  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 Normal adrenal glands and kidneys.  Incompletely imaged are peritoneal implants, including within the gastrocolic ligament 3.2 cm on image 61/series 16. No ascites.  IMPRESSION: 1. Biliary ductal dilatation without obstructive stone or mass identified. Cannot exclude ampullary stenosis or otherwise occult ampullary lesion. ERCP should be considered. 2. Ill-defined vague area of high right liver lobe heterogeneous enhancement and multi focal cystic change. Considerations include an atypical appearance of infiltrative hepatocellular carcinoma, peribiliary cysts superimposed upon  biliary ductal dilatation, or an atypical entity such as hepatic peliosis. 3. Incompletely imaged peritoneal masses. 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 Recommend further evaluation with PET to direct biopsy. This will also allow evaluation of the liver more entirely. 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   Electronically Signed   By: KMarylyn Ishihara  Jobe Igo M.D.   On: 06/20/2014 10:50   Mr 3d Recon At Scanner  06/20/2014   CLINICAL DATA:  CT demonstrating liver lesions. History pancreatitis. Diabetes and hypertension.  EXAM: MRI ABDOMEN WITHOUT AND WITH CONTRAST (INCLUDING MRCP)  TECHNIQUE: Multiplanar multisequence MR imaging of the abdomen was performed both before and after the administration of intravenous contrast. Heavily T2-weighted images of the biliary and pancreatic ducts were obtained, and three-dimensional MRCP images were rendered by post processing.  CONTRAST:  17m MULTIHANCE GADOBENATE DIMEGLUMINE 529 MG/ML IV SOLN  COMPARISON:  CT dated 06/18/2014.  FINDINGS: Mild cardiomegaly, without pericardial or pleural effusion.  No definite evidence of cirrhosis. Nonspecific focus of precontrast T1 hyperintensity within the high right lobe of the liver on image 26/series 1500. Heterogeneous enhancement with multi focal hypoenhancing/cystic foci throughout the dome of the right lobe of the liver, extending more inferiorly to straddle the anterior and posterior segments. This area is somewhat ill-defined, but measures on the order of 9.2 x 9.1 cm on image 24/series 15002. Vessels and dilated intrahepatic ducts appear to traverse this area, including on coronal image 30 of series 16.  A more vague area of similar morphology extends into the posterior segment right lobe of the liver on image 30/series 15003.  Nonspecific focus of arterial hyper enhancement in the subcapsular right lobe on image 41/series 1501. Likely a perfusion anomaly.  Mild splenomegaly, greater than 13 cm  craniocaudal.  Small hiatal hernia.  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 There is no pancreatic ductal dilatation or dominant pancreatic mass. Moderate common duct dilatation, including at 1.6 cm on image 50/series 6. Mild to moderate intrahepatic ductal dilatation. No obstructive stone or mass identified.  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 Normal adrenal glands and kidneys.  Incompletely imaged are peritoneal implants, including within the gastrocolic ligament 3.2 cm on image 61/series 16. No ascites.  IMPRESSION: 1. Biliary ductal dilatation without obstructive stone or mass identified. Cannot exclude ampullary stenosis or otherwise occult ampullary lesion. ERCP should be considered. 2. Ill-defined vague area of high right liver lobe heterogeneous enhancement and multi focal cystic change. Considerations include an atypical appearance of infiltrative hepatocellular carcinoma, peribiliary cysts superimposed upon biliary ductal dilatation, or an atypical entity such as hepatic peliosis. 3. Incompletely imaged peritoneal masses. 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 Recommend further evaluation with PET to direct biopsy. This will also allow evaluation of the liver more entirely. 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   Electronically Signed   By: KAbigail MiyamotoM.D.   On: 06/20/2014 10:50   Mr Abd W/wo Cm/mrcp  06/20/2014   CLINICAL DATA:  CT demonstrating liver lesions. History pancreatitis. Diabetes and hypertension.  EXAM: MRI ABDOMEN WITHOUT AND WITH CONTRAST (INCLUDING MRCP)  TECHNIQUE: Multiplanar multisequence MR imaging of the abdomen was performed both before and after the administration of intravenous contrast. Heavily T2-weighted images of the biliary and pancreatic ducts were obtained, and three-dimensional MRCP images were rendered by post processing.  CONTRAST:  150mMULTIHANCE GADOBENATE DIMEGLUMINE 529  MG/ML IV SOLN  COMPARISON:  CT dated 06/18/2014.  FINDINGS: Mild cardiomegaly, without pericardial or pleural effusion.  No definite evidence of cirrhosis. Nonspecific focus of precontrast T1 hyperintensity within the high right lobe of the liver on image 26/series 1500. Heterogeneous enhancement with multi focal hypoenhancing/cystic foci throughout the dome of the right lobe of the liver, extending more inferiorly to straddle the anterior and posterior segments. This area is somewhat  ill-defined, but measures on the order of 9.2 x 9.1 cm on image 24/series 15002. Vessels and dilated intrahepatic ducts appear to traverse this area, including on coronal image 30 of series 16.  A more vague area of similar morphology extends into the posterior segment right lobe of the liver on image 30/series 15003.  Nonspecific focus of arterial hyper enhancement in the subcapsular right lobe on image 41/series 1501. Likely a perfusion anomaly.  Mild splenomegaly, greater than 13 cm craniocaudal.  Small hiatal hernia.  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 There is no pancreatic ductal dilatation or dominant pancreatic mass. Moderate common duct dilatation, including at 1.6 cm on image 50/series 6. Mild to moderate intrahepatic ductal dilatation. No obstructive stone or mass identified.  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 Normal adrenal glands and kidneys.  Incompletely imaged are peritoneal implants, including within the gastrocolic ligament 3.2 cm on image 61/series 16. No ascites.  IMPRESSION: 1. Biliary ductal dilatation without obstructive stone or mass identified. Cannot exclude ampullary stenosis or otherwise occult ampullary lesion. ERCP should be considered. 2. Ill-defined vague area of high right liver lobe heterogeneous enhancement and multi focal cystic change. Considerations include an atypical appearance of infiltrative hepatocellular carcinoma, peribiliary cysts  superimposed upon biliary ductal dilatation, or an atypical entity such as hepatic peliosis. 3. Incompletely imaged peritoneal masses. 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 Recommend further evaluation with PET to direct biopsy. This will also allow evaluation of the liver more entirely. 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   Electronically Signed   By: Abigail Miyamoto M.D.   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 Unable to expose and visualize the papilla. RECOMMENDATIONS: 1) Continue work up for a malignant process. 2) If there is true biliary obstruction a percutaneous access will be required.  Colonoscopy 06/22/14: COLON FINDINGS: A normal appearing cecum, ileocecal valve, and appendiceal orifice were identified. The ascending, hepatic flexure, transverse, splenic flexure, descending, sigmoid colon and rectum appeared unremarkable. No polyps or cancers were seen. Retroflexed views revealed no abnormalities. The time to cecum=5 minutes 24 seconds. Withdrawal time=7 minutes 30 seconds. The scope was withdrawn and the procedure completed. COMPLICATIONS: There were no complications. ENDOSCOPIC IMPRESSION: Normal colon , nothing to explain CT scan abnormality involving transverse colon( no evidence of ischemic colitis) RECOMMENDATIONS: advance diet continue Angiomax further disposition as per Dr Benson Norway who will be back on 06/22/2014  Admission HPI:  Wendy Meza is a 73 year old woman with history of HTN, DM, atrial fibrillation on Eliquis, pancreatitis in 7262 complicated by pseudocyst s/p drainage, ischemic colitis s/p ex lap 04/12/2014 presenting with epigastric pain, nausea, and vomiting. She reports the epigastric pain started this afternoon. It was constant, dull pain. She last ate at 8AM. No exacerbating factors identified. She reports dizziness and lightheadedness and steatorrhea - last BM  this AM. She denies fevers, chills, chest pain, shortness of breath, melena, hematochezia, dysuria, hematuria, dark urine, edema, rash, jaundice, pruritus, myalgias. This pain is similar to her past episode of pancreatitis in 2002. Last colonoscopy 4-5 years ago without any abnormal findings per patient. She had an upper scope in 2002 during the pancreatitis.  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 Meza underwent thrombectomy for an SMA thrombus thought to be from atrial fibrillation on 04/12/14.  She presented with epigastric abdominal pain, nausea, and vomiting that was initially thought to be due to gallstone pancreatitis with an elevated lipase >3000 and elevated AST 1217, ALT 787, total bili 4.0, and Alk Phos 186 on admission.  However, CT abdomen/pelvis showed no evidence of acute pancreatitis, but omental and mesenteric masses along with multiple ill-defined lesions in R hepatic lobe concerning for malignancy. Her lipase returned to normal two days after admission with slow resolution of her elevate LFTs.  Hem/onc was consulted, and Dr. Beryle Beams recommended an MRI liver that showed heterogenous enhancement and multi-focal cystic change, which would be atypical for malignancy. These images, which included limited views of the abdominal masses, were reviewed with radiology who thought the liver changes were consistent with venous thrombosis.  The abdominal masses appeared to be pseudoaneurysms with newly clotted blood present, and it was noted that one of the abdominal masses was located adjacent to a surgical  clip placed during her SMA thrombectomy.  Furthermore, the masses were not noted on her abdominal CT at [facility] in June 2015, making malignancy less likely.  GI was consulted and performed a colonoscopy that was normal and showed no sign of malignancy.  However, malignancy could not be completely ruled out, and it is recommended that an abdominal MRI with contrast be performed in 6 weeks (~08/01/14) to monitor resolution of the blood clots.  She was discharged with a follow-up lab visit 3 days after discharge and a follow-up appointment in the Internal Medicine Clinic with Dr. Trudee Kuster in 2 weeks to monitor resolution of her elevated LFTs and continued resolution of her symptoms.  #Acute pancreatitis Wendy Meza has a history of acute pancreatitis in 2002 and presented with abdominal pain, nausea, and vomiting with an elevated lipase >3000.  Abdominal ultrasound demonstrated CBD dilation up to 1.8 cm with intrahepatic biliary ductal dilation consistent with gallstone pancreatitis.  She was kept NPO and given intravenous fluids for treatment of pancreatitis on admission.  However, abdominal CT did not show any signs of pancreatitis, and her lipase returned to normal two days after admission with resolution of her abdominal pain.  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  Given the resolution of her symptoms, Dr. Benson Norway of GI did not recommend further workup because this would have to be percutaneous given the location of her major papilla.  #Acute onset pancytopenia  Wendy Meza was started on a heparin drip on admission due to plans for an ERCP by GI and need for quick reversal of anticoagulation.  The following  day she was noted to have pancytopenia with WBC (6.9>2.4), Hgb (12.2>8.8), and Platelets (123>54) all decreased from admission.  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  Heparin was stopped immediately and a HIT  panel was sent for analysis.  Hem/onc was consulted who raised concern for early DIC, but no schistocytes were seen on blood smear and her fibrinogen was normal despite elevated d-dimer. Dr. 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  Per the lab, screening for HIT with platelet factor 4 ELISA was positive at low optical density, but the C14 serotonin release confirmatory assay was still pending at the time of discharge.  After stopping heparin, her blood counts steadily rebounded to WBC 3.2, Hgb 10.9, and Platelets 112 on discharge.  While HIT remains likely, the possibility of pancytopenia related to her acute liver failure was raised.  #Hypercoagulability  Given Wendy Meza's history of SMA thrombosis and the development of new thromboses while on Eliquis, there was concern for underlying hypercoagulability and further work up was recommended by Hem/onc.  Work-up was largely negative with borderline increased anti-beta-2-glycoprotein and decreased protein S activity that does not explain her clinical picture.  At hem/onc's recommendation, it was planned to switch her to warfarin for long-term anticoagulation given the lack of studies using NOACs to prevent portal venous thrombosis and the development of new thromboses while on Eliquis.  Due to the concern for HIT, she switched from bivalirudin to Arixtra for outpatient bridging to warfarin.  Dr. Beryle Beams was able to obtain a two-week sample of Arixtra for Wendy Meza to use.  She was given a dose of warfarin  5 mg prior to discharge, but her dose was switched to 2.5 mg daily based on Dr. Azucena Freed recommendations.  She was scheduled to have her INR checked on 06/27/14 so her warfarin dosing could be modified prior to her follow-up appointment in the [facility] on 07/05/14.  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 She has been treated with Eliquis for anticoagulation along with both sotalol and metoprolol at home.  There was some question about her home dose of metoprolol as she reported taking 25 mg of metoprolol tartrate daily at home.  After resolution of her abdominal pain, she was started on her home dose of sotalol 80 mg BID and metoprolol 12.5 mg BID due to a heart rate in the 50s while in sinus rhythm.  Her heart rate would occasionally increase into the 120s due to paroxysmal atrial fibrillation with RVR, so her metoprolol was increased to 25 mg BID and cardiology was consulted.  They recommended giving her metoprolol 12.5 mg BID due to her low heart rates while in sinus rhythm along with anticoagulation with warfarin as detailed above.  She was discharged home on metoprolol 12.5 mg BID.  #DM2  Wendy Meza home metformin 500 mg BID was held during her admission, and she was given a moderate sensitivity insulin sliding scale with increasing Lantus QHS as her diet increased.  She was discharged with her home regimen.  #HTN  Wendy Meza blood pressure was well-controlled after resuming her home regimen of lisinopril 20 mg BID, furosemide 40 mg daily, and metoprolol 12.5 mg BID.  #HLD  Lipid panel on admission showed cholesterol 73, triglycerides 73, HDL 47, LDL 11.  She was continued on her home Crestor 20 mg QHS.  Discharge Vitals:   BP 146/62  Pulse 55  Temp(Src) 98.4 F (36.9 C) (Oral)  Resp 18  Ht 5' 1" (1.549 m)  Wt 140 lb 14 oz (63.9 kg)  BMI 26.63 kg/m2  SpO2 97%  Discharge Labs:  Results for  orders placed during the hospital encounter of 06/18/14 (from the past 24 hour(s))  GLUCOSE, CAPILLARY     Status: Abnormal   Collection Time    06/23/14  4:18 PM      Result Value Ref Range   Glucose-Capillary 435 (*) 70 - 99 mg/dL  GLUCOSE, CAPILLARY     Status: Abnormal   Collection Time    06/23/14  9:00 PM      Result Value Ref Range   Glucose-Capillary 246 (*) 70 - 99 mg/dL   Comment 1 Notify RN    CBC     Status: Abnormal   Collection Time    06/24/14  3:55 AM      Result Value Ref Range   WBC 3.2 (*) 4.0 - 10.5 K/uL   RBC 3.80 (*) 3.87 - 5.11 MIL/uL   Hemoglobin 10.9 (*) 12.0 - 15.0 g/dL   HCT 33.0 (*) 36.0 - 46.0 %   MCV 86.8  78.0 - 100.0 fL   MCH 28.7  26.0 - 34.0 pg   MCHC 33.0  30.0 - 36.0 g/dL   RDW 14.7  11.5 - 15.5 %   Platelets 112 (*) 150 - 400 K/uL  APTT     Status: Abnormal   Collection Time    06/24/14  3:55 AM      Result Value Ref Range   aPTT 58 (*) 24 - 37 seconds  BASIC METABOLIC PANEL     Status: Abnormal   Collection Time    06/24/14  3:55 AM      Result Value Ref Range   Sodium 144  137 - 147 mEq/L   Potassium 4.2  3.7 - 5.3 mEq/L   Chloride 107  96 - 112 mEq/L   CO2 26  19 - 32 mEq/L   Glucose, Bld 150 (*) 70 - 99 mg/dL   BUN 11  6 - 23 mg/dL   Creatinine, Ser 0.63  0.50 - 1.10 mg/dL   Calcium 9.1  8.4 - 10.5 mg/dL   GFR calc non Af Amer 88 (*) >90 mL/min   GFR calc Af Amer >90  >90 mL/min   Anion gap 11  5 - 15  HEPATIC FUNCTION PANEL     Status: Abnormal   Collection Time    06/24/14  5:00 AM      Result Value Ref Range   Total Protein 5.9 (*) 6.0 - 8.3 g/dL   Albumin 3.2 (*) 3.5 - 5.2 g/dL   AST 33  0 - 37 U/L   ALT 146 (*) 0 - 35 U/L   Alkaline Phosphatase 288 (*) 39 - 117 U/L   Total Bilirubin 1.1  0.3 - 1.2 mg/dL   Bilirubin, Direct 0.4 (*) 0.0 - 0.3 mg/dL   Indirect Bilirubin 0.7  0.3 - 0.9 mg/dL  GLUCOSE, CAPILLARY     Status: Abnormal   Collection Time    06/24/14  6:14 AM      Result Value Ref Range    Glucose-Capillary 150 (*) 70 - 99 mg/dL   Comment 1 Notify RN    GLUCOSE, CAPILLARY     Status: Abnormal   Collection Time    06/24/14 10:52 AM      Result Value Ref Range   Glucose-Capillary 269 (*) 70 - 99 mg/dL    Signed: Arman Filter, MD 06/24/2014, 12:45 PM    Services Ordered on Discharge: None. Equipment Ordered on Discharge: None.

## 2014-06-24 NOTE — Progress Notes (Signed)
Pt is back in sinus brady in the 50's.

## 2014-06-24 NOTE — Progress Notes (Signed)
  PROGRESS NOTE MEDICINE TEACHING ATTENDING   Day 6 of stay Patient name: Wendy Meza   Medical record number: [id number] Date of birth: [birth year]    I met with Ms Lucia this morning with the entire team. The patient does not have any symptoms related to abdominal pain, nausea, vomiting and is eating regular food. She feels well, and is ready for discharge. Her discharge planning anticoagulation has been formulated with discussions between consultants from cardiology, hematology,  Medicine and pharmacy.    I reviewed Dr Vivia Budge note from today and I agree with the exam, assessement and plan of care. I have discussed the care of this patient with my IM team residents. Please see the resident note for details.  [place], [place] 06/24/2014, 11:07 AM.

## 2014-06-24 NOTE — Progress Notes (Signed)
Dr. Tommi Rumps notified of atrial fib in 120's , BP 154/77, pt asymptomatic. No new orders. Will continue to monitor.

## 2014-06-24 NOTE — Progress Notes (Signed)
Discharge education, medication, follow-up appts, and prescriptions reviewed with pt and pts husband, both stated understanding and that they had no questions, Coumadin and injection education given to pt, handouts given to pt  IV and tele removed Rickard Rhymes, RN

## 2014-06-25 LAB — HEPARIN INDUCED THROMBOCYTOPENIA PNL
Heparin Induced Plt Ab: POSITIVE
PATIENT O. D.: 0.64
UFH High Dose UFH H: 4 % Release
UFH LOW DOSE 0.1 IU/ML: 2 %
UFH Low Dose 0.5 IU/mL: 2 % Release
UFH SRA Result: NEGATIVE

## 2014-06-27 ENCOUNTER — Telehealth: Payer: Self-pay | Admitting: Internal Medicine

## 2014-06-27 ENCOUNTER — Other Ambulatory Visit (INDEPENDENT_AMBULATORY_CARE_PROVIDER_SITE_OTHER): Payer: Medicare HMO

## 2014-06-27 DIAGNOSIS — I81 Portal vein thrombosis: Secondary | ICD-10-CM

## 2014-06-27 DIAGNOSIS — K55069 Acute infarction of intestine, part and extent unspecified: Secondary | ICD-10-CM

## 2014-06-27 DIAGNOSIS — K55059 Acute (reversible) ischemia of intestine, part and extent unspecified: Secondary | ICD-10-CM

## 2014-06-27 LAB — CBC
HCT: 36.5 % (ref 36.0–46.0)
Hemoglobin: 12.2 g/dL (ref 12.0–15.0)
MCH: 29.5 pg (ref 26.0–34.0)
MCHC: 33.4 g/dL (ref 30.0–36.0)
MCV: 88.2 fL (ref 78.0–100.0)
PLATELETS: 144 10*3/uL — AB (ref 150–400)
RBC: 4.14 MIL/uL (ref 3.87–5.11)
RDW: 14.6 % (ref 11.5–15.5)
WBC: 3.6 10*3/uL — ABNORMAL LOW (ref 4.0–10.5)

## 2014-06-27 LAB — COMPLETE METABOLIC PANEL WITH GFR
ALT: 70 U/L — AB (ref 0–35)
AST: 26 U/L (ref 0–37)
Albumin: 3.6 g/dL (ref 3.5–5.2)
Alkaline Phosphatase: 272 U/L — ABNORMAL HIGH (ref 39–117)
BUN: 18 mg/dL (ref 6–23)
CO2: 28 mEq/L (ref 19–32)
CREATININE: 0.62 mg/dL (ref 0.50–1.10)
Calcium: 10 mg/dL (ref 8.4–10.5)
Chloride: 102 mEq/L (ref 96–112)
GFR, Est African American: >89 mL/min
GFR, Est Non African American: >89 mL/min
Glucose, Bld: 176 mg/dL — ABNORMAL HIGH (ref 70–99)
Potassium: 4.3 mEq/L (ref 3.5–5.3)
Sodium: 142 mEq/L (ref 135–145)
Total Bilirubin: 1.3 mg/dL — ABNORMAL HIGH (ref 0.2–1.2)
Total Protein: 6.5 g/dL (ref 6.0–8.3)

## 2014-06-27 LAB — APTT: aPTT: 39 seconds — ABNORMAL HIGH (ref 24–37)

## 2014-06-27 LAB — PROTIME-INR
INR: 1.88 — AB (ref <1.50)
PROTHROMBIN TIME: 21.6 s — AB (ref 11.6–15.2)

## 2014-06-27 MED ORDER — WARFARIN SODIUM 2.5 MG PO TABS: 5.0000 mg | ORAL_TABLET | Freq: Every day | ORAL | Status: DC

## 2014-06-27 NOTE — Telephone Encounter (Signed)
I called Wendy Meza and told her the results of her labs from today.  She continues to do well without pain, nausea, vomiting, or bleeding, and her blood counts are improving.  Her INR is 1.88 today after receiving 4 days of warfarin 2.5 mg, so I told her to increase her dose to 5 mg daily.  I sent a new prescription to her pharmacy.  She is scheduled to see me in clinic next Friday 07/05/14, and I will repeat her labs.

## 2014-06-28 ENCOUNTER — Telehealth: Payer: Self-pay | Admitting: Internal Medicine

## 2014-06-28 DIAGNOSIS — I81 Portal vein thrombosis: Secondary | ICD-10-CM

## 2014-06-28 NOTE — Telephone Encounter (Signed)
Thanks Dr Trudee Kuster.

## 2014-06-28 NOTE — Telephone Encounter (Signed)
Called Wendy Meza to ask her to come in on Tuesday morning (07/02/14) at 9:30 am for a repeat INR.  She said she can make it and reports continuing to do well over the last few days.

## 2014-07-02 ENCOUNTER — Other Ambulatory Visit (INDEPENDENT_AMBULATORY_CARE_PROVIDER_SITE_OTHER): Payer: Medicare HMO

## 2014-07-02 ENCOUNTER — Telehealth: Payer: Self-pay | Admitting: Internal Medicine

## 2014-07-02 DIAGNOSIS — I81 Portal vein thrombosis: Secondary | ICD-10-CM

## 2014-07-02 LAB — CBC WITH DIFFERENTIAL/PLATELET
Basophils Absolute: 0 10*3/uL (ref 0.0–0.1)
Basophils Relative: 1 % (ref 0–1)
EOS PCT: 3 % (ref 0–5)
Eosinophils Absolute: 0.1 10*3/uL (ref 0.0–0.7)
HCT: 37.1 % (ref 36.0–46.0)
Hemoglobin: 12.9 g/dL (ref 12.0–15.0)
LYMPHS ABS: 1.3 10*3/uL (ref 0.7–4.0)
LYMPHS PCT: 31 % (ref 12–46)
MCH: 29.6 pg (ref 26.0–34.0)
MCHC: 34.8 g/dL (ref 30.0–36.0)
MCV: 85.1 fL (ref 78.0–100.0)
MONO ABS: 0.4 10*3/uL (ref 0.1–1.0)
Monocytes Relative: 9 % (ref 3–12)
Neutro Abs: 2.4 10*3/uL (ref 1.7–7.7)
Neutrophils Relative %: 56 % (ref 43–77)
PLATELETS: 140 10*3/uL — AB (ref 150–400)
RBC: 4.36 MIL/uL (ref 3.87–5.11)
RDW: 14.4 % (ref 11.5–15.5)
WBC: 4.2 10*3/uL (ref 4.0–10.5)

## 2014-07-02 LAB — COMPLETE METABOLIC PANEL WITH GFR
ALBUMIN: 3.6 g/dL (ref 3.5–5.2)
ALT: 32 U/L (ref 0–35)
AST: 24 U/L (ref 0–37)
Alkaline Phosphatase: 217 U/L — ABNORMAL HIGH (ref 39–117)
BUN: 16 mg/dL (ref 6–23)
CALCIUM: 9.5 mg/dL (ref 8.4–10.5)
CHLORIDE: 103 meq/L (ref 96–112)
CO2: 29 mEq/L (ref 19–32)
CREATININE: 0.63 mg/dL (ref 0.50–1.10)
GFR, Est African American: >89 mL/min
GFR, Est Non African American: >89 mL/min
Glucose, Bld: 207 mg/dL — ABNORMAL HIGH (ref 70–99)
Potassium: 4.6 mEq/L (ref 3.5–5.3)
Sodium: 144 mEq/L (ref 135–145)
Total Bilirubin: 1.2 mg/dL (ref 0.2–1.2)
Total Protein: 6.6 g/dL (ref 6.0–8.3)

## 2014-07-02 LAB — APTT: aPTT: 51 seconds — ABNORMAL HIGH (ref 24–37)

## 2014-07-02 LAB — PROTIME-INR
INR: 4.89 — ABNORMAL HIGH (ref <1.50)
PROTHROMBIN TIME: 45.6 s — AB (ref 11.6–15.2)

## 2014-07-02 MED ORDER — WARFARIN SODIUM 2.5 MG PO TABS: 5.0000 mg | ORAL_TABLET | Freq: Every day | ORAL | Status: DC

## 2014-07-02 NOTE — Addendum Note (Signed)
Addended by: Truddie Crumble on: 07/02/2014 09:38 AM   Modules accepted: Orders

## 2014-07-02 NOTE — Telephone Encounter (Signed)
I called Ms. Diosdado to let her know about her INR of 4.89.  She says she has been doing well other than some occasional palpitations from her atrial fibrillation, and she denies any hematuria, hematochezia, or other bleeding.  She has been taking 5 mg of Coumadin every day since last Thursday.  After discussing with Dr. Elie Confer of pharmacy, I asked her to hold her Coumadin dose for today.  She will take 2.5 mg tomorrow (Wedensday), then 5 mg Thursday, and I will see her in clinic on Friday to recheck her INR and adjust her dosing.  Because she is supratherapeutic and has received Coumadin for >5 days, I told her stop taking her Arixtra injections.  I told her to contact us if she develops any bleeding.

## 2014-07-02 NOTE — Addendum Note (Signed)
Addended by: Truddie Crumble on: 07/02/2014 09:39 AM   Modules accepted: Orders

## 2014-07-05 ENCOUNTER — Encounter: Payer: Self-pay | Admitting: Internal Medicine

## 2014-07-05 ENCOUNTER — Ambulatory Visit (INDEPENDENT_AMBULATORY_CARE_PROVIDER_SITE_OTHER): Payer: Medicare HMO | Admitting: Internal Medicine

## 2014-07-05 VITALS — BP 107/56 | HR 61 | Temp 96.7°F | Ht 61.0 in | Wt 143.4 lb

## 2014-07-05 DIAGNOSIS — I4891 Unspecified atrial fibrillation: Secondary | ICD-10-CM

## 2014-07-05 DIAGNOSIS — I81 Portal vein thrombosis: Secondary | ICD-10-CM

## 2014-07-05 DIAGNOSIS — K55059 Acute (reversible) ischemia of intestine, part and extent unspecified: Secondary | ICD-10-CM

## 2014-07-05 DIAGNOSIS — Z7901 Long term (current) use of anticoagulants: Secondary | ICD-10-CM

## 2014-07-05 DIAGNOSIS — K55069 Acute infarction of intestine, part and extent unspecified: Secondary | ICD-10-CM

## 2014-07-05 LAB — POCT INR: INR: 4

## 2014-07-05 MED ORDER — WARFARIN SODIUM 2.5 MG PO TABS: 2.5000 mg | ORAL_TABLET | Freq: Every day | ORAL | Status: AC

## 2014-07-05 NOTE — Patient Instructions (Signed)
Thank you for coming to clinic today Wendy Meza.  General instructions: -Do not take Coumadin tonight or tomorrow, then resume taking 2.5 mg every night. -Go to your PCP's office on Monday to get your blood checked.  Dr. Edison Nasuti can adjust your dosing further. -Make sure to go to your hospital follow up appointment with Dr. Edison Nasuti on 07/11/14. -Call Dr. Edison Nasuti or our clinic at [phone] if you have any problems.

## 2014-07-06 DIAGNOSIS — Z7901 Long term (current) use of anticoagulants: Secondary | ICD-10-CM | POA: Insufficient documentation

## 2014-07-06 NOTE — Assessment & Plan Note (Addendum)
Started on Coumadin due to portal venous thrombosis while on Eliquis.  Supratherapeutic today with INR of 4.0 despite reduced dose.  Talked with Dr. Elie Confer who recommended that she hold her Coumadin dose today and tomorrow, then resume 2.5 mg daily on Sunday 07/07/14.  She needs an INR checked on Monday and further dosing adjustments. -Called and scheduled an appointment with her PCP Dr. Edison Nasuti to get an INR checked on Monday. -Her nurse verified that Dr. Edison Nasuti will check the lab and adjust her dosing as needed. -She will see Dr. Edison Nasuti on 07/11/14.

## 2014-07-06 NOTE — Assessment & Plan Note (Addendum)
Her labs and abdominal pain have resolved since her hospitalization.  It is likely that her acute pancytopenia was due to her decreased liver function because the HIT testing was negative.  She does not currently have any complaints.  Despite her recovery, the cause of her thrombosis warrants further work-up, and she needs close monitoring in case she develops any signs that could suggest an underlying malignancy.  She needs a repeat MRI in 4 weeks to make sure the liver and abdominal finding have resolved and have not progressed. -Called and left a message with her PCP Dr. Eugenia Pancoast nurse about the need for further monitoring. -Confirmed receipt of discharge summary by her PCP's office. -Will call on Monday and attempt to discuss face to face. -Repeat abdominal MRI with contrast in 4 weeks by PCP. -Continue anticoagulation with Coumadin indefinitely.

## 2014-07-06 NOTE — Progress Notes (Addendum)
   Subjective:    Patient ID: Wendy Meza, female    DOB: 1941-04-21, 73 y.o.   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  [name] was recently hospitalized with abdominal pain and found to have imaging findings consistent with portal venous thrombosis despite being anticoagulated with Eliquis.  At that time, she had elevated liver function tests and developed acute pancytopenia.  She discharged from the hospital on 06/24/14 on Coumadin 2.5 mg daily with a fondaparinux bridge due to concern for HIT.  Since that time, her serotonin release assay came back negative for HIT.  Her INR was initially 1.88 on 06/27/14, so her Coumadin dose was increased to 5 mg daily.  On 07/02/14, her INR was 4.89, so she was told to hold her dose for that day and take 2.5 mg on 07/03/14, then 5 mg on 07/04/14.  Her fondaparinux was stopped at that time. Since discharge, her LFTs and blood counts have continued to improve.  Today, she reports continuing to feel better with no nausea or abdominal pain.  She has not noticed any bruising or bleeding.  She does report occasional palpitations that she thinks could be from her atrial fibrillation. She is planning on returning to [place] on Sunday and has an appointment with her PCP on 07/11/14.  Review of Systems  Constitutional: Negative for fever, chills, diaphoresis, activity change, appetite change and unexpected weight change.  HENT: Negative for congestion and rhinorrhea.   Eyes: Negative for visual disturbance.  Respiratory: Negative for cough, chest tightness, shortness of breath and wheezing.   Cardiovascular: Positive for palpitations. Negative for leg swelling.  Gastrointestinal: Negative for  nausea, vomiting, diarrhea, constipation, blood in stool and abdominal distention.  Genitourinary: Negative for hematuria and difficulty urinating.  Musculoskeletal: Negative for arthralgias and myalgias.  Skin: Negative for rash.  Neurological: Negative for light-headedness.  Psychiatric/Behavioral: Negative for dysphoric mood.       Objective:   Physical Exam  Constitutional: She is oriented to person, place, and time. She appears well-developed and well-nourished.  HENT:  Head: Normocephalic and atraumatic.  Eyes: Conjunctivae and EOM are normal. Pupils are equal, round, and reactive to light. No scleral icterus.  Cardiovascular: Normal rate and regular rhythm.  Exam reveals no gallop and no friction rub.   No murmur heard. Pulmonary/Chest: Effort normal and breath sounds normal.  Abdominal: Soft. Bowel sounds are normal. She exhibits no distension. There is no tenderness.  Musculoskeletal: Normal range of motion. She exhibits no edema.  Bandages on lower extremities.  Neurological: She is alert and oriented to person, place, and time. No cranial nerve deficit.  Skin: Skin is warm and dry.  Psychiatric: She has a normal mood and affect.       Assessment & Plan:  Please see problem-based assessment and plan.

## 2014-07-06 NOTE — Assessment & Plan Note (Signed)
In sinus rhythm today with good rate. Occasionally having palpitations. -Continue metoprolol 12.5 mg BID and sotalol 80 mg BID. -Tried to contact her cardiologist, Dr. Orbie Hurst, to discuss the switch to Coumadin.  Left a message on his voice mail explaining the situation and leaving my cell phone number for him to contact me.  Discharge summary was sent to his office, and I will send this note as well.

## 2014-07-08 ENCOUNTER — Encounter: Payer: Self-pay | Admitting: Internal Medicine

## 2014-07-08 ENCOUNTER — Telehealth: Payer: Self-pay | Admitting: Internal Medicine

## 2014-07-08 NOTE — Telephone Encounter (Signed)
I spoke with Wendy Meza's PCP, Dr. Berna Spare, and discussed Wendy Meza's recent hospitalization for portal venous thrombosis and the need for ongoing Coumadin anti-coagulation.  I also emphasized the importance of a repeat abdominal MRI in 4 weeks to rule out a malignant process and evaluate for resolution of her liver findings and blood clots in her abdomen.  Dr. Edison Nasuti said that Wendy Meza was in the waiting room currently for her INR check.  Wendy Meza is scheduled to follow up with Dr. Edison Nasuti on 07/11/14.

## 2014-07-09 NOTE — Progress Notes (Signed)
Internal Medicine Clinic Attending Date of visit: 07/05/2014  I saw and evaluated the patient.  I personally confirmed the key portions of the history and exam documented by Dr. Trudee Kuster and I reviewed pertinent patient test results.  The assessment, diagnosis, and plan were formulated together and I agree with the documentation in the resident's note.

## 2014-07-17 NOTE — Telephone Encounter (Signed)
Thanks for following through! DrG

## 2016-06-25 IMAGING — MR MR ABDOMEN WO/W CM
11 of 20 series · 20 of 48 positions shown · IV contrast (multihance)
Comparison: CT dated 06/18/2014.

CLINICAL DATA: CT demonstrating liver lesions. History
pancreatitis. Diabetes and hypertension.

EXAM:
MRI ABDOMEN WITHOUT AND WITH CONTRAST (INCLUDING MRCP)
TECHNIQUE: Multiplanar multisequence MR imaging of the abdomen was performed
both before and after the administration of intravenous contrast.
Heavily T2-weighted images of the biliary and pancreatic ducts were
obtained, and three-dimensional MRCP images were rendered by post
processing.
CONTRAST:  14mL MULTIHANCE GADOBENATE DIMEGLUMINE 529 MG/ML IV SOLN

[Series 3: T2 · coronal · 5.0mm · 0.78mm/px · 1 of 36 slices shown (1 of 2)]
[im 1/36]
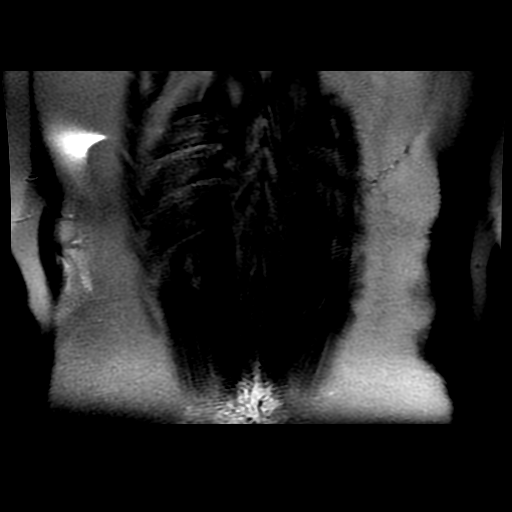

[Series 4: T2 · axial · 5.0mm · 0.78mm/px · 1 of 32 slices shown (2 of 2)]
[im 1/32]
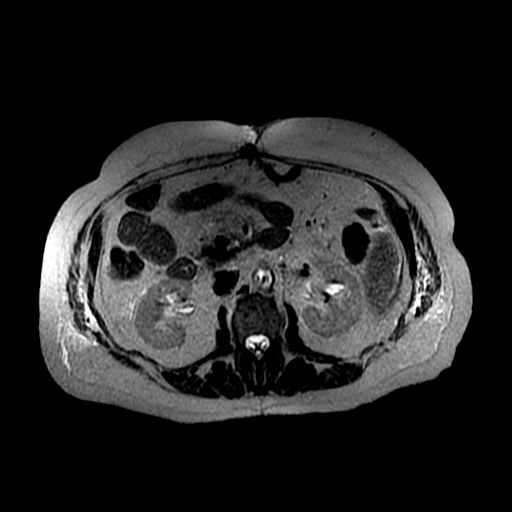

[Series 5: T2 fat-sat · axial · 5.0mm · 0.78mm/px · z∈[-58,+97]mm · 2 of 32 slices shown]
[im 1/32]
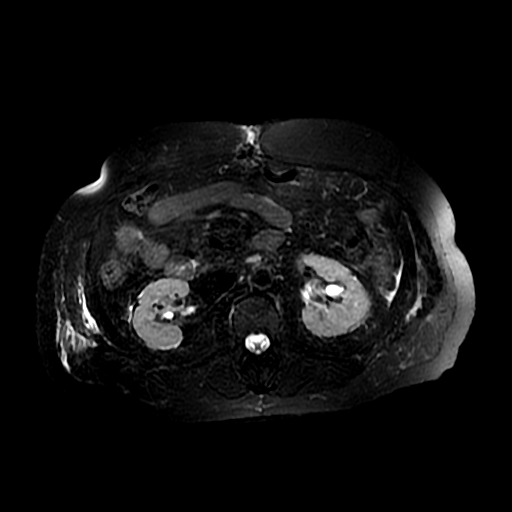
[im 32/32]
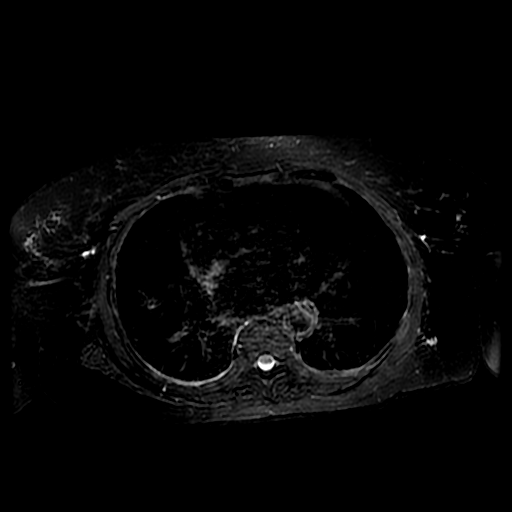

[Series 7: MRCP · coronal · 2.0mm · 0.70mm/px · 2 of 38 slices shown (1 of 2)]
[im 1/38]
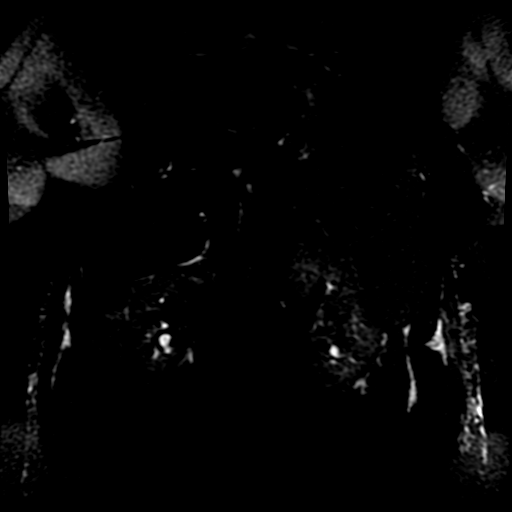
[im 38/38]
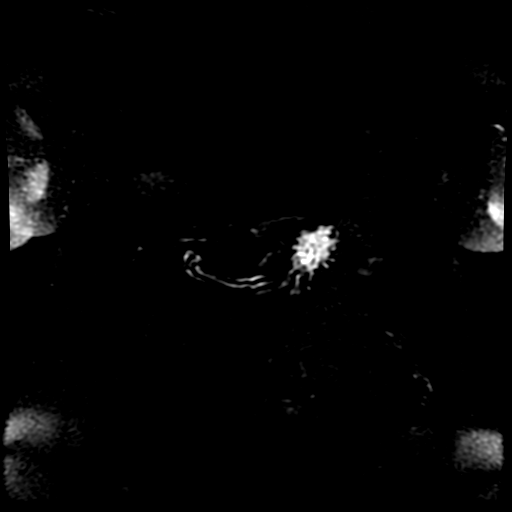

[Series 8: DWI b500 · axial · 6.0mm · 1.48mm/px · z∈[-73,+106]mm · 2 of 48 slices shown]
[im 1/48]
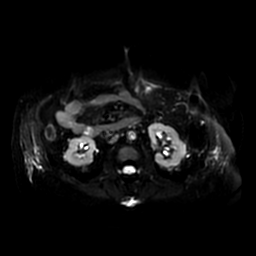
[im 48/48]
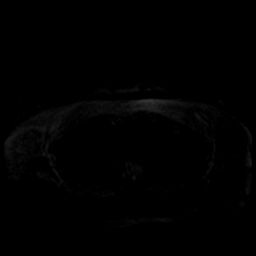

[Series 11: ax dualecho · axial · 5.0mm · 0.78mm/px · z∈[-61,+84]mm · 3 of 60 slices shown]
[im 1/60]
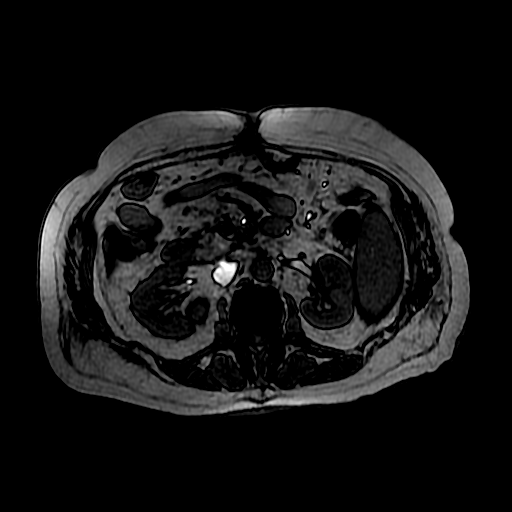
[im 30/60]
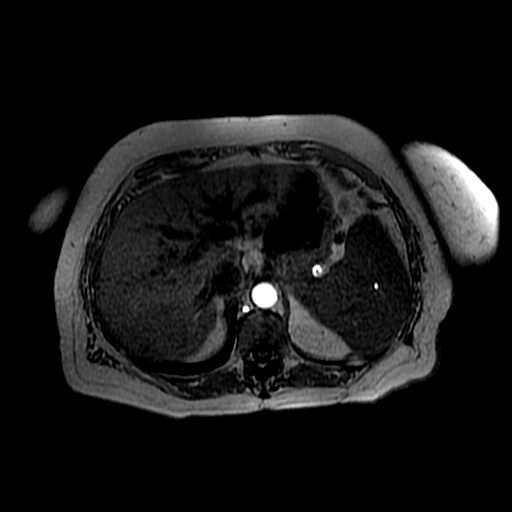
[im 60/60]
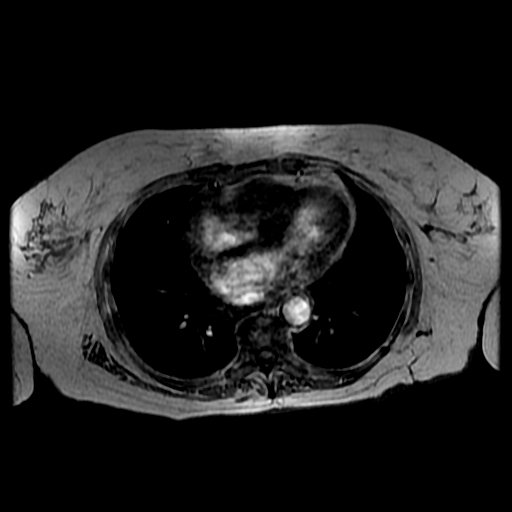

[Series 14: MRCP · oblique · 40.0mm · 0.70mm/px · 1 of 5 slices shown (2 of 2)]
[im 1/5]
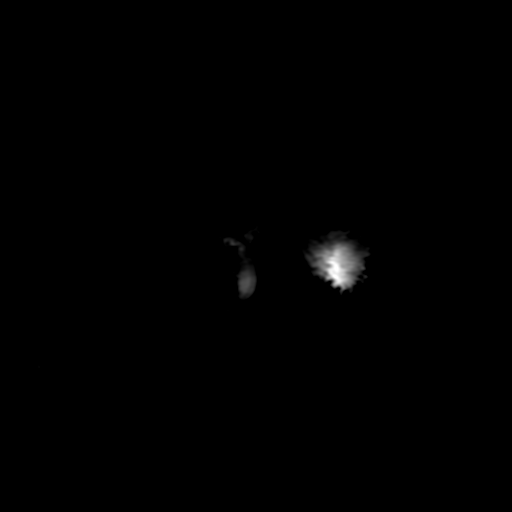

[Series 16: T1 dynamic post-contrast · coronal · 5.0mm · 0.78mm/px · 3 of 72 slices shown]
[im 1/72]
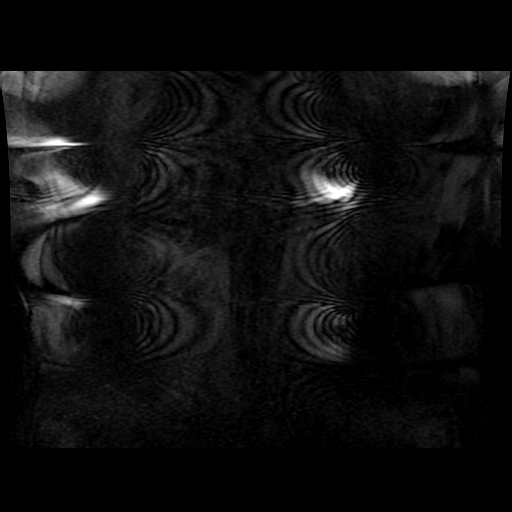
[im 36/72]
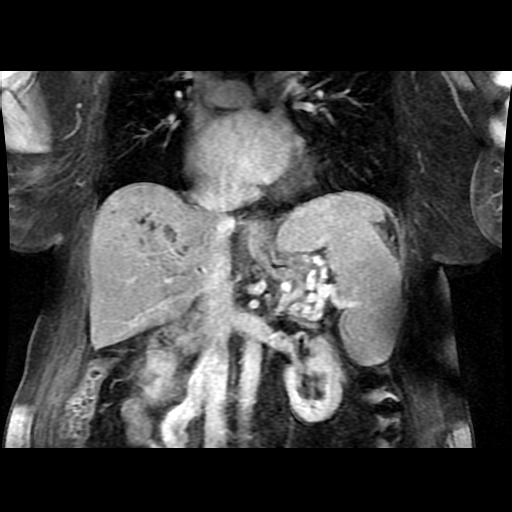
[im 72/72]
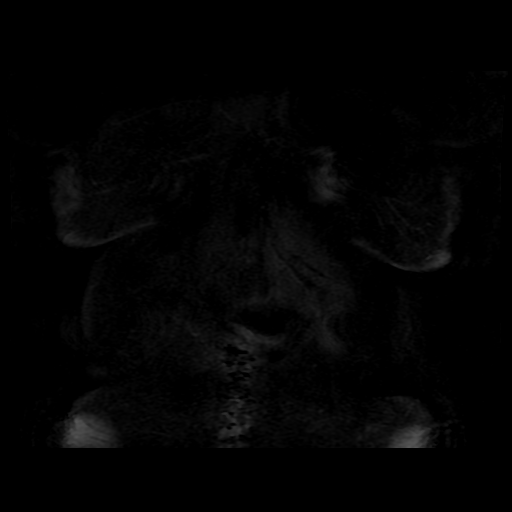

[Series 601: processed images · coronal · 1.6mm · 0.27mm/px · 1 of 5 slices shown]
[im 1/5]
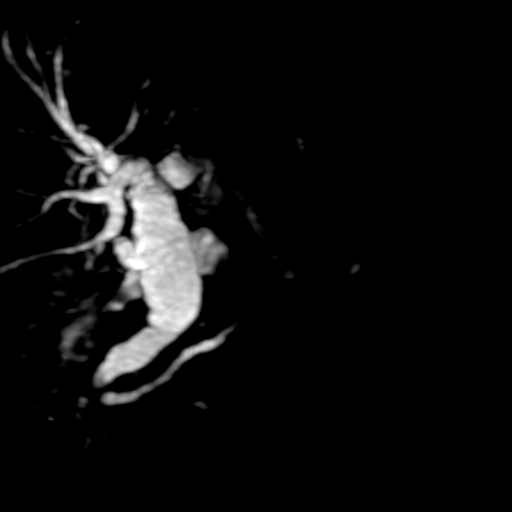

[Series 800: DWI · axial · 6.0mm · 1.48mm/px · 1 of 24 slices shown]
[im 1/24]
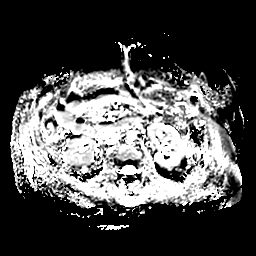

[Series 1500: T1 dynamic · axial · 5.0mm · 0.78mm/px · z∈[-69,+89]mm · 3 of 64 slices shown]
[im 1/64]
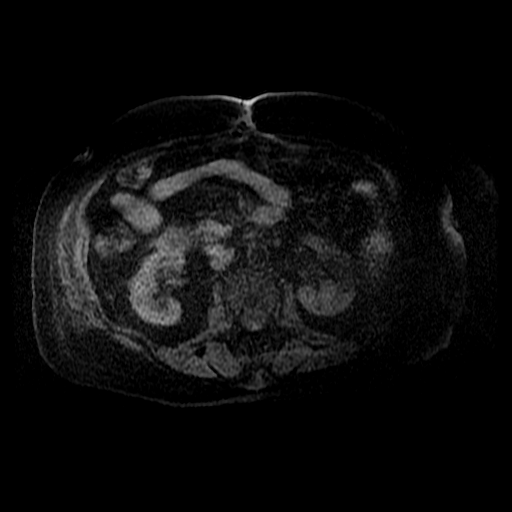
[im 32/64]
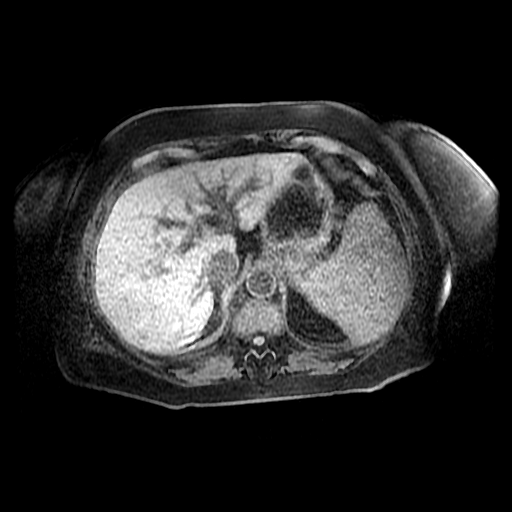
[im 64/64]
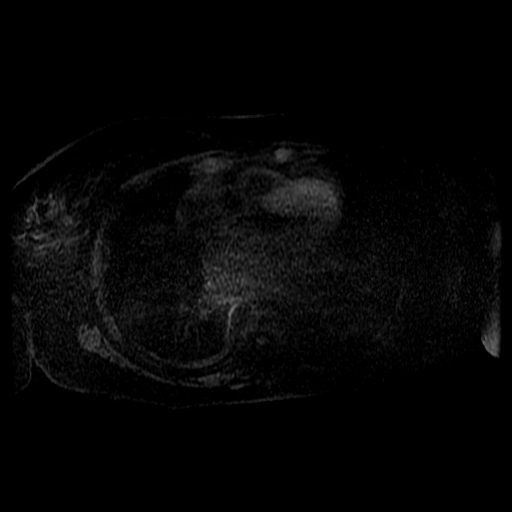

[20 of 48 positions shown; findings below may reference images not displayed]

FINDINGS: Mild cardiomegaly, without pericardial or pleural effusion.

No definite evidence of cirrhosis. Nonspecific focus of precontrast
T1 hyperintensity within the high right lobe of the liver on image
26/series 3011. Heterogeneous enhancement with multi focal
hypoenhancing/cystic foci throughout the dome of the right lobe of
the liver, extending more inferiorly to straddle the anterior and
posterior segments. This area is somewhat ill-defined, but measures
on the order of 9.2 x 9.1 cm on image 24/series 69557. Vessels and
dilated intrahepatic ducts appear to traverse this area, including
on coronal image 30 of series 16.

A more vague area of similar morphology extends into the posterior
segment right lobe of the liver on image 30/series 20004.

Nonspecific focus of arterial hyper enhancement in the subcapsular
right lobe on image 41/series 7977. Likely a perfusion anomaly.

Mild splenomegaly, greater than 13 cm craniocaudal.

Small hiatal hernia.

The pancreatic body and tail are likely developmentally absent.
There is no pancreatic ductal dilatation or dominant pancreatic
mass. Moderate common duct dilatation, including at 1.6 cm on image
50/series 6. Mild to moderate intrahepatic ductal dilatation. No
obstructive stone or mass identified.

Cavernous transformation of the portal vein again identified. Normal
adrenal glands and kidneys.

Incompletely imaged are peritoneal implants, including within the
gastrocolic ligament 3.2 cm on image 61/series 16. No ascites.
IMPRESSION: 1. Biliary ductal dilatation without obstructive stone or mass
identified. Cannot exclude ampullary stenosis or otherwise occult
ampullary lesion. ERCP should be considered.
2. Ill-defined vague area of high right liver lobe heterogeneous
enhancement and multi focal cystic change. Considerations include an
atypical appearance of infiltrative hepatocellular carcinoma,
peribiliary cysts superimposed upon biliary ductal dilatation, or an
atypical entity such as hepatic peliosis.
3. Incompletely imaged peritoneal masses. Favor metastatic disease
from an otherwise occult primary. Recommend further evaluation with
PET to direct biopsy. This will also allow evaluation of the liver
more entirely.
4. Chronic cavernous transformation of the portal vein.
